# Patient Record
Sex: Male | Born: 1980 | Race: White | Hispanic: No | Marital: Married | Smoking: Current every day smoker
Health system: Southern US, Community
[De-identification: ages and names within clinical notes are randomized; demographics above are authoritative.]

## PROBLEM LIST (undated history)

## (undated) DIAGNOSIS — M419 Scoliosis, unspecified: Secondary | ICD-10-CM

## (undated) DIAGNOSIS — S3992XA Unspecified injury of lower back, initial encounter: Secondary | ICD-10-CM

## (undated) DIAGNOSIS — S199XXA Unspecified injury of neck, initial encounter: Secondary | ICD-10-CM

## (undated) DIAGNOSIS — B1920 Unspecified viral hepatitis C without hepatic coma: Secondary | ICD-10-CM

## (undated) HISTORY — PX: MOUTH SURGERY: SHX715

## (undated) HISTORY — DX: Unspecified viral hepatitis C without hepatic coma: B19.20

## (undated) HISTORY — DX: Unspecified injury of lower back, initial encounter: S39.92XA

## (undated) HISTORY — DX: Scoliosis, unspecified: M41.9

## (undated) HISTORY — DX: Unspecified injury of neck, initial encounter: S19.9XXA

---

## 2005-01-30 ENCOUNTER — Emergency Department: Payer: Self-pay | Admitting: Emergency Medicine

## 2005-07-22 ENCOUNTER — Emergency Department: Payer: Self-pay | Admitting: Emergency Medicine

## 2005-08-29 ENCOUNTER — Emergency Department: Payer: Self-pay | Admitting: Emergency Medicine

## 2005-08-29 ENCOUNTER — Other Ambulatory Visit: Payer: Self-pay

## 2005-09-08 ENCOUNTER — Inpatient Hospital Stay (HOSPITAL_COMMUNITY): Admission: AD | Admit: 2005-09-08 | Discharge: 2005-09-14 | Payer: Self-pay | Admitting: Psychiatry

## 2005-09-09 ENCOUNTER — Ambulatory Visit: Payer: Self-pay | Admitting: Psychiatry

## 2005-12-21 ENCOUNTER — Emergency Department: Payer: Self-pay | Admitting: Emergency Medicine

## 2005-12-21 ENCOUNTER — Other Ambulatory Visit: Payer: Self-pay

## 2007-05-25 ENCOUNTER — Ambulatory Visit: Payer: Self-pay | Admitting: Family Medicine

## 2007-06-08 ENCOUNTER — Emergency Department: Payer: Self-pay | Admitting: Emergency Medicine

## 2007-06-11 ENCOUNTER — Emergency Department: Payer: Self-pay | Admitting: Emergency Medicine

## 2007-08-21 ENCOUNTER — Emergency Department: Payer: Self-pay | Admitting: Emergency Medicine

## 2007-08-21 ENCOUNTER — Other Ambulatory Visit: Payer: Self-pay

## 2007-08-28 ENCOUNTER — Emergency Department: Payer: Self-pay | Admitting: Emergency Medicine

## 2007-10-08 ENCOUNTER — Emergency Department: Payer: Self-pay | Admitting: Emergency Medicine

## 2008-06-03 ENCOUNTER — Emergency Department (HOSPITAL_COMMUNITY): Admission: EM | Admit: 2008-06-03 | Discharge: 2008-06-03 | Payer: Self-pay | Admitting: Emergency Medicine

## 2008-06-05 ENCOUNTER — Emergency Department (HOSPITAL_COMMUNITY): Admission: EM | Admit: 2008-06-05 | Discharge: 2008-06-05 | Payer: Self-pay | Admitting: Emergency Medicine

## 2008-06-17 ENCOUNTER — Emergency Department: Payer: Self-pay | Admitting: Emergency Medicine

## 2008-11-23 ENCOUNTER — Emergency Department (HOSPITAL_COMMUNITY): Admission: EM | Admit: 2008-11-23 | Discharge: 2008-11-23 | Payer: Self-pay | Admitting: Emergency Medicine

## 2008-12-03 ENCOUNTER — Emergency Department: Payer: Self-pay | Admitting: Internal Medicine

## 2009-12-08 ENCOUNTER — Inpatient Hospital Stay: Payer: Self-pay | Admitting: Internal Medicine

## 2009-12-17 ENCOUNTER — Emergency Department: Payer: Self-pay | Admitting: Emergency Medicine
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
## 2010-11-26 NOTE — Discharge Summary (Signed)
NAMEBRAYLEE, BOSHER NO.:  [id number]   MEDICAL RECORD NO.:  [id number]          PATIENT TYPE:  IPS   LOCATION:  0506                          FACILITY:  [facility]   PHYSICIAN:  Jeanice Lim, M.D. DATE OF BIRTH:  [birth year]   DATE OF ADMISSION:  09/08/2005  DATE OF DISCHARGE:  09/14/2005                                 DISCHARGE SUMMARY   IDENTIFYING DATA:  This is a 30 year old white male involuntarily admitted   DICTATION ENDED HERE      Jeanice Lim, M.D.  Electronically Signed     JEM/MEDQ  D:  10/09/2005  T:  10/10/2005  Job:  [id number]
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
## 2010-11-26 NOTE — Discharge Summary (Signed)
Bryan Patton, Bryan Patton             ACCOUNT NO.:  [id number]   MEDICAL RECORD NO.:  [id number]          PATIENT TYPE:  IPS   LOCATION:  0506                          [facility]ACILITY:  [facility]   PHYSICIAN:  Jeanice Lim, M.D. DATE OF BIRTH:  15-Mar-1981   DATE OF ADMISSION:  09/08/2005  DATE OF DISCHARGE:  09/14/2005                                 DISCHARGE SUMMARY   IDENTIFYING DATA:  This is a 30 year old male, single, involuntarily  admitted.  Petitioned by mother for paranoid behavior.  Refusing to leave  the house, fear people were going to break in.  Being concerned with his  safety when he took a knife out of the kitchen and was keeping them next to  his bed, fearing that someone saw them.  The patient is a poor historian.  Denying all symptoms.  Insight generally superficial.  A little bit of  agitation when he had mother on the phone.  Clearly guarded, paranoid but  enough insight to attempt to deny the presence of both symptoms.  The  patient had been hospitalized at [facility] for alcohol abuse and  bizarre behavior on August 31, 2005.  Released from [facility] September 01, 2005 on Risperdal 1 mg twice a day and had been abusing alcohol at that  time but apparently no alcohol had been consumed for the last week or two.   PAST PSYCHIATRIC HISTORY:  Second psychiatric admission, likely for first  psychiatric episode.   SOCIAL HISTORY:  This is a single white male currently living with mother.  Feels that he needs to be employed and everything would be fine and, if he  got sleep, everything would be fine.   FAMILY HISTORY:  History of depressive disorder in the father who committed  suicide in 2005.   MEDICAL HISTORY:  The patient denied significant past medical history.  Had  had asthma as a child.  Had been noncompliant with Risperdal.   ALLERGIES:  No known drug allergies.   PHYSICAL EXAMINATION:  Physical and neurologic exam essentially within  normal  limits.   REVIEW OF SYSTEMS:  Negative.   LABORATORY DATA:  Routine admission labs essentially within normal limits.   MENTAL STATUS EXAM:  Fully alert male.  Anxious affect.  Clear, compulsive  movements.  Manner guarded.  Attention variable.  Clearly possibly  distracted.  Possible internal distractions, looking away, focusing on the  wall next to him, glancing around and then trying to give short, superficial  answers that everything is okay and that people have problems and people who  have problems are in the hospital.  Trying to rationalize any questions that  are brought up, somewhat defensive and guarded.  Clearly, intention is for  him to be released from the hospital.  However, he does appear to be  experiencing a thought disorder which has caused distress and safety  concerns for the mother.  The patient's concentration and attention was  impaired.  No pressure of speech, however.  Mood instability, able to settle  self down.  Other than talking to mother, did not get agitated.  Mood was  guarded, anxious, paranoid, somewhat dysphoric and distractible.  Seems to  minimize reasons for being hospitalized and attempting but very hard to  appear normal.  Insight superficial.  Impulse control guarded.  Judgment and  insight impaired.  Cognition intact.   ADMISSION DIAGNOSES:  AXIS I:  Mood disorder not otherwise specified.  Rule  out psychotic disorder not otherwise specified.  History of alcohol abuse.  AXIS II:  None.  AXIS III:  Tachycardia not otherwise specified, decreased TSH, rule out  hypothyroidism.  AXIS IV:  Deferred.  AXIS V:  20-25; unclear of the last year, however likely 55-60.   HOSPITAL COURSE:  The patient to be placed on safety checks and was ordered  p.r.n. medications for possible unpredictable behavior, agitation.  The  patient was given now medications after getting agitated when talking to his  mother on the phone.  Reported having no problems.  No side  effects from the  medications.  Not wanting to really take a lot of medications.  Admitting  that his primary problem may be having difficulty sleeping and that, if he  had a job, he would be fine.  The patient gradually showed less guardedness  and some increased openness in describing the symptoms that he had  experienced, including paranoid thoughts, feeling that someone was going to  break in and kill him or his mom, misinterpreting sounds and believing that  he needed to defend himself and have knives near him.  Not sleeping for some  time, pacing, agitated, possibly having side effects from the Risperdal and  not compliant.  Did consistently state that he had not been drinking  recently which is likely but likely was noncompliant with the Risperdal,  possibly due to intolerability.  The patient's insight gradually increased.  The patient was aware that he did need to be hospitalized.  The patient was  optimized on Zyprexa Zydis which resulted in improved sleep and improved  thought process.  The patient likely was mildly paranoid but able to discuss  this and denied any safety concerns.  No suicidal thoughts, homicidal  thoughts and no feeling that someone was attempting to kill him any longer  and is aware that this was his distorted thinking.  Significant improvement  in insight by the time of discharge.   CONDITION ON DISCHARGE:  The patient was discharged in improved condition.  Given medication education again and reported motivation to be compliant  with the follow-up plan.   DISCHARGE MEDICATIONS:  1.  Ambien 10 mg q.h.s. p.r.n. insomnia.  2.  Zyprexa Zydis 15 mg at 9 p.m.  3.  Lamictal 25 mg q.a.m. for 11 days and then 2 q.a.m. targeting possible      mood instability and a thought disorder.  Possible first break of      bipolar with psychotic features versus schizoaffective disorder.     History of a family suicide and possible mood disorder with follow-up at       [facility] with Dr. Lang Snow on September 20, 2005      at 3 p.m.   DISCHARGE DIAGNOSES:  AXIS I:  Mood disorder not otherwise specified.  Rule  out psychotic disorder not otherwise specified.  History of alcohol abuse.  AXIS II:  None.  AXIS III:  Tachycardia not otherwise specified, decreased TSH, rule out  hypothyroidism.  AXIS IV:  Deferred.  AXIS V:  GAF on discharge 55.   The patient was educated  regarding his condition and his coping skills and a  safety plan as well as medications, risk/benefit ratio and alternative  treatments, risk of TD, EPS, metabolic issues and other precautions.  The  patient was set up with therapy and discharged in improved condition.  Mother was comfortable with the follow-up plan and happy with his  improvement.      Jeanice Lim, M.D.  Electronically Signed     JEM/MEDQ  D:  10/09/2005  T:  10/10/2005  Job:  [id number]

## 2011-06-17 ENCOUNTER — Ambulatory Visit: Payer: Self-pay | Admitting: Rheumatology

## 2011-08-11 ENCOUNTER — Ambulatory Visit: Payer: Self-pay | Admitting: Pain Medicine

## 2011-08-11 ENCOUNTER — Ambulatory Visit: Payer: Self-pay | Admitting: Internal Medicine

## 2011-09-02 ENCOUNTER — Emergency Department: Payer: Self-pay | Admitting: Emergency Medicine

## 2011-09-02 LAB — CBC
HCT: 48.7 % (ref 40.0–52.0)
MCH: 30.8 pg (ref 26.0–34.0)
MCHC: 33.7 g/dL (ref 32.0–36.0)
MCV: 91 fL (ref 80–100)
Platelet: 307 10*3/uL (ref 150–440)
RDW: 13.7 % (ref 11.5–14.5)

## 2011-09-02 LAB — URINALYSIS, COMPLETE
Bacteria: NONE SEEN
Blood: NEGATIVE
Glucose,UR: NEGATIVE mg/dL (ref 0–75)
Leukocyte Esterase: NEGATIVE
Nitrite: NEGATIVE
Ph: 8 (ref 4.5–8.0)

## 2011-09-02 LAB — COMPREHENSIVE METABOLIC PANEL
Albumin: 4.3 g/dL (ref 3.4–5.0)
Bilirubin,Total: 0.4 mg/dL (ref 0.2–1.0)
EGFR (African American): >60
SGPT (ALT): 82 U/L — ABNORMAL HIGH
Sodium: 141 mmol/L (ref 136–145)
Total Protein: 8.2 g/dL (ref 6.4–8.2)

## 2011-09-02 LAB — LIPASE, BLOOD: Lipase: 128 U/L (ref 73–393)

## 2011-11-12 ENCOUNTER — Emergency Department: Payer: Self-pay | Admitting: Emergency Medicine

## 2011-11-12 LAB — CBC
HCT: 44 % (ref 40.0–52.0)
MCH: 30.6 pg (ref 26.0–34.0)
MCHC: 34.1 g/dL (ref 32.0–36.0)
MCV: 90 fL (ref 80–100)
Platelet: 289 10*3/uL (ref 150–440)
RDW: 13.1 % (ref 11.5–14.5)
WBC: 6.9 10*3/uL (ref 3.8–10.6)

## 2011-11-12 LAB — COMPREHENSIVE METABOLIC PANEL
Albumin: 4.2 g/dL (ref 3.4–5.0)
Anion Gap: 8 (ref 7–16)
BUN: 15 mg/dL (ref 7–18)
Bilirubin,Total: 0.4 mg/dL (ref 0.2–1.0)
Creatinine: 0.97 mg/dL (ref 0.60–1.30)
EGFR (African American): >60
EGFR (Non-African Amer.): >60
Glucose: 87 mg/dL (ref 65–99)
Osmolality: 281 (ref 275–301)
Potassium: 3.9 mmol/L (ref 3.5–5.1)
SGOT(AST): 42 U/L — ABNORMAL HIGH (ref 15–37)
SGPT (ALT): 74 U/L
Total Protein: 7.9 g/dL (ref 6.4–8.2)

## 2011-11-12 LAB — DRUG SCREEN, URINE
Amphetamines, Ur Screen: NEGATIVE (ref ?–1000)
Barbiturates, Ur Screen: NEGATIVE (ref ?–200)
Benzodiazepine, Ur Scrn: NEGATIVE (ref ?–200)
Methadone, Ur Screen: POSITIVE (ref ?–300)
Phencyclidine (PCP) Ur S: NEGATIVE (ref ?–25)

## 2011-11-12 LAB — URINALYSIS, COMPLETE
Bacteria: NEGATIVE
Blood: NEGATIVE
Ketone: NEGATIVE
Nitrite: NEGATIVE
Ph: 6 (ref 4.5–8.0)
Protein: NEGATIVE
Specific Gravity: 1.013 (ref 1.003–1.030)

## 2012-02-29 ENCOUNTER — Emergency Department: Payer: Self-pay

## 2012-03-17 ENCOUNTER — Inpatient Hospital Stay: Payer: Self-pay | Admitting: Psychiatry

## 2012-03-17 LAB — URINALYSIS, COMPLETE
Bilirubin,UR: NEGATIVE
Glucose,UR: NEGATIVE mg/dL (ref 0–75)
Leukocyte Esterase: NEGATIVE
Nitrite: NEGATIVE
Squamous Epithelial: <1
WBC UR: 2 /HPF (ref 0–5)

## 2012-03-17 LAB — DRUG SCREEN, URINE
Barbiturates, Ur Screen: NEGATIVE (ref ?–200)
Cannabinoid 50 Ng, Ur ~~LOC~~: NEGATIVE (ref ?–50)
Cocaine Metabolite,Ur ~~LOC~~: NEGATIVE (ref ?–300)
Methadone, Ur Screen: POSITIVE (ref ?–300)

## 2012-03-17 LAB — COMPREHENSIVE METABOLIC PANEL
Albumin: 4.5 g/dL (ref 3.4–5.0)
Alkaline Phosphatase: 94 U/L (ref 50–136)
Bilirubin,Total: 0.4 mg/dL (ref 0.2–1.0)
Co2: 29 mmol/L (ref 21–32)
Creatinine: 0.98 mg/dL (ref 0.60–1.30)
Osmolality: 280 (ref 275–301)
Sodium: 140 mmol/L (ref 136–145)

## 2012-03-17 LAB — CBC
HCT: 46.2 % (ref 40.0–52.0)
MCH: 30.1 pg (ref 26.0–34.0)
MCV: 89 fL (ref 80–100)

## 2012-03-18 LAB — BEHAVIORAL MEDICINE 1 PANEL
Alkaline Phosphatase: 73 U/L (ref 50–136)
Basophil #: 0.1 10*3/uL (ref 0.0–0.1)
Basophil %: 0.9 %
Calcium, Total: 9.6 mg/dL (ref 8.5–10.1)
EGFR (African American): >60
EGFR (Non-African Amer.): >60
Eosinophil %: 2.1 %
Glucose: 101 mg/dL — ABNORMAL HIGH (ref 65–99)
HGB: 14.6 g/dL (ref 13.0–18.0)
Lymphocyte #: 1.8 10*3/uL (ref 1.0–3.6)
MCH: 29.9 pg (ref 26.0–34.0)
MCV: 88 fL (ref 80–100)
Monocyte #: 0.6 x10 3/mm (ref 0.2–1.0)
RBC: 4.88 10*6/uL (ref 4.40–5.90)
SGOT(AST): 39 U/L — ABNORMAL HIGH (ref 15–37)
SGPT (ALT): 54 U/L (ref 12–78)
Thyroid Stimulating Horm: 0.708 u[IU]/mL

## 2012-04-08 ENCOUNTER — Emergency Department: Payer: Self-pay | Admitting: Emergency Medicine

## 2012-04-10 ENCOUNTER — Emergency Department: Payer: Self-pay | Admitting: Unknown Physician Specialty

## 2012-04-11 ENCOUNTER — Emergency Department: Payer: Self-pay | Admitting: Emergency Medicine

## 2012-04-18 ENCOUNTER — Emergency Department: Payer: Self-pay | Admitting: Emergency Medicine

## 2012-04-18 LAB — CBC
HCT: 46.6 % (ref 40.0–52.0)
HGB: 15.5 g/dL (ref 13.0–18.0)
MCH: 29.7 pg (ref 26.0–34.0)
MCV: 89 fL (ref 80–100)
RBC: 5.21 10*6/uL (ref 4.40–5.90)

## 2012-04-18 LAB — BASIC METABOLIC PANEL
Chloride: 104 mmol/L (ref 98–107)
Co2: 32 mmol/L (ref 21–32)
Creatinine: 0.9 mg/dL (ref 0.60–1.30)
Sodium: 143 mmol/L (ref 136–145)

## 2012-05-03 ENCOUNTER — Encounter: Payer: Self-pay | Admitting: *Deleted

## 2012-05-09 ENCOUNTER — Ambulatory Visit: Payer: Self-pay | Admitting: Cardiovascular Disease

## 2012-05-14 ENCOUNTER — Encounter: Payer: Self-pay | Admitting: Cardiovascular Disease

## 2012-05-22 ENCOUNTER — Encounter: Payer: Self-pay | Admitting: Cardiovascular Disease

## 2012-05-22 ENCOUNTER — Ambulatory Visit (INDEPENDENT_AMBULATORY_CARE_PROVIDER_SITE_OTHER): Payer: Medicaid Other | Admitting: Cardiovascular Disease

## 2012-05-22 VITALS — BP 124/84 | HR 67 | Ht 72.0 in | Wt 172.0 lb

## 2012-05-22 DIAGNOSIS — R9389 Abnormal findings on diagnostic imaging of other specified body structures: Secondary | ICD-10-CM

## 2012-05-22 DIAGNOSIS — R079 Chest pain, unspecified: Secondary | ICD-10-CM

## 2012-05-22 DIAGNOSIS — R931 Abnormal findings on diagnostic imaging of heart and coronary circulation: Secondary | ICD-10-CM
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
## 2012-05-22 NOTE — Patient Instructions (Addendum)
STOP smoking  No follow up scheduled.  Call us if we can be of help to you
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
## 2012-05-23 ENCOUNTER — Ambulatory Visit: Payer: Self-pay | Admitting: Family Medicine

## 2012-08-08 ENCOUNTER — Ambulatory Visit: Payer: Self-pay | Admitting: Family Medicine

## 2012-11-24 ENCOUNTER — Encounter: Payer: Self-pay | Admitting: Nurse Practitioner

## 2012-11-26 ENCOUNTER — Ambulatory Visit: Payer: Self-pay | Admitting: Nurse Practitioner

## 2012-12-30 ENCOUNTER — Emergency Department (HOSPITAL_COMMUNITY)
Admission: EM | Admit: 2012-12-30 | Discharge: 2012-12-31 | Disposition: A | Discharge status: Other Acute Inpatient Hospital | Payer: Medicaid Other | Attending: Emergency Medicine | Admitting: Emergency Medicine

## 2012-12-30 ENCOUNTER — Encounter (HOSPITAL_COMMUNITY): Payer: Self-pay | Admitting: Emergency Medicine

## 2012-12-30 DIAGNOSIS — F319 Bipolar disorder, unspecified: Secondary | ICD-10-CM | POA: Insufficient documentation

## 2012-12-30 DIAGNOSIS — M542 Cervicalgia: Secondary | ICD-10-CM | POA: Insufficient documentation

## 2012-12-30 DIAGNOSIS — F411 Generalized anxiety disorder: Secondary | ICD-10-CM | POA: Insufficient documentation

## 2012-12-30 DIAGNOSIS — F209 Schizophrenia, unspecified: Secondary | ICD-10-CM | POA: Insufficient documentation

## 2012-12-30 DIAGNOSIS — G8929 Other chronic pain: Secondary | ICD-10-CM | POA: Insufficient documentation

## 2012-12-30 DIAGNOSIS — Z888 Allergy status to other drugs, medicaments and biological substances status: Secondary | ICD-10-CM | POA: Insufficient documentation

## 2012-12-30 DIAGNOSIS — M549 Dorsalgia, unspecified: Secondary | ICD-10-CM | POA: Insufficient documentation

## 2012-12-30 DIAGNOSIS — Z79899 Other long term (current) drug therapy: Secondary | ICD-10-CM | POA: Insufficient documentation

## 2012-12-30 DIAGNOSIS — F17200 Nicotine dependence, unspecified, uncomplicated: Secondary | ICD-10-CM | POA: Insufficient documentation

## 2012-12-30 DIAGNOSIS — M4120 Other idiopathic scoliosis, site unspecified: Secondary | ICD-10-CM | POA: Insufficient documentation

## 2012-12-30 DIAGNOSIS — F1910 Other psychoactive substance abuse, uncomplicated: Secondary | ICD-10-CM | POA: Insufficient documentation

## 2012-12-30 DIAGNOSIS — B1920 Unspecified viral hepatitis C without hepatic coma: Secondary | ICD-10-CM | POA: Insufficient documentation

## 2012-12-30 LAB — COMPREHENSIVE METABOLIC PANEL
ALT: 28 U/L (ref 0–53)
AST: 24 U/L (ref 0–37)
CO2: 30 mEq/L (ref 19–32)
Chloride: 101 mEq/L (ref 96–112)
GFR calc non Af Amer: >90 mL/min (ref >90)
Glucose, Bld: 119 mg/dL — ABNORMAL HIGH (ref 70–99)
Sodium: 139 mEq/L (ref 135–145)
Total Bilirubin: 0.2 mg/dL — ABNORMAL LOW (ref 0.3–1.2)

## 2012-12-30 LAB — ACETAMINOPHEN LEVEL: Acetaminophen (Tylenol), Serum: <15 ug/mL (ref 10–30)

## 2012-12-30 LAB — GLUCOSE, CAPILLARY: Glucose-Capillary: 121 mg/dL — ABNORMAL HIGH (ref 70–99)

## 2012-12-30 LAB — CBC
Hemoglobin: 14.9 g/dL (ref 13.0–17.0)
MCV: 87.2 fL (ref 78.0–100.0)
Platelets: 211 10*3/uL (ref 150–400)
RBC: 4.99 MIL/uL (ref 4.22–5.81)
WBC: 7.1 10*3/uL (ref 4.0–10.5)

## 2012-12-30 LAB — RAPID URINE DRUG SCREEN, HOSP PERFORMED
Cocaine: NOT DETECTED
Opiates: POSITIVE — AB

## 2012-12-30 LAB — SALICYLATE LEVEL: Salicylate Lvl: <2 mg/dL — ABNORMAL LOW (ref 2.8–20.0)

## 2012-12-30 MED ORDER — ACETAMINOPHEN 325 MG PO TABS: 650.0000 mg | ORAL_TABLET | ORAL | Status: DC | PRN

## 2012-12-30 MED ORDER — ZOLPIDEM TARTRATE 5 MG PO TABS
5.0000 mg | ORAL_TABLET | Freq: Every evening | ORAL | Status: DC | PRN
  Administered 2012-12-31: 5 mg via ORAL
  Filled 2012-12-30: qty 1

## 2012-12-30 MED ORDER — ONDANSETRON HCL 4 MG PO TABS: 4.0000 mg | ORAL_TABLET | Freq: Three times a day (TID) | ORAL | Status: DC | PRN

## 2012-12-30 MED ORDER — ALUM & MAG HYDROXIDE-SIMETH 200-200-20 MG/5ML PO SUSP: 30.0000 mL | ORAL | Status: DC | PRN

## 2012-12-30 MED ORDER — IBUPROFEN 200 MG PO TABS: 600.0000 mg | ORAL_TABLET | Freq: Three times a day (TID) | ORAL | Status: DC | PRN

## 2012-12-30 MED ORDER — LORAZEPAM 1 MG PO TABS
1.0000 mg | ORAL_TABLET | Freq: Three times a day (TID) | ORAL | Status: DC | PRN
  Administered 2012-12-31: 1 mg via ORAL
  Filled 2012-12-30: qty 1

## 2012-12-30 MED ORDER — NICOTINE 21 MG/24HR TD PT24
21.0000 mg | MEDICATED_PATCH | Freq: Every day | TRANSDERMAL | Status: DC
  Administered 2012-12-30: 21 mg via TRANSDERMAL
  Filled 2012-12-30: qty 1

## 2012-12-30 NOTE — ED Notes (Signed)
Pt here for detox. Pt has methadone pill bottle filled 12/17/2012. Pt has 8 whole pills and 12 1/2 pills left out of 120 pills, reports last dose yesterday. Oxycodone filled 12/17/2012.  16 large pills and 3 whole small pills, 3 1/2 small pills and 1/4 of a small pill all in same jar. Last use of percocet yesterday. Pt also has prescription for xanax and clonopine which he does not have the jar here with him. Family reports pt also has morphine prescription but does not know where the pills are at. Pt admits to over use of medications. Family reports that pt had altered mental status yesterday and feels that he has been overdosing on his medications for past couple of days.
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
## 2012-12-30 NOTE — BH Assessment (Signed)
[facility] Assessment Progress Note      12/30/12.  2350.  [facility] left message asking for bed availability.  [facility] does [facility] male detox bed.  Pt info faxed over for review.  OV is NA due to pt having medicaid.  [facility] is full. Daleen Squibb, LCSWA

## 2012-12-30 NOTE — ED Notes (Signed)
Pt received dinner tray but stated he has had no appetite. Pt is resting in room at this time.
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
## 2012-12-30 NOTE — ED Notes (Signed)
Diet tray ordered 

## 2012-12-31 ENCOUNTER — Encounter (HOSPITAL_COMMUNITY): Payer: Self-pay

## 2012-12-31 ENCOUNTER — Inpatient Hospital Stay (HOSPITAL_COMMUNITY)
Admission: AD | Admit: 2012-12-31 | Discharge: 2013-01-04 | DRG: 897 | Disposition: A | Discharge status: Home / Self Care | Payer: Medicare Other | Source: Intra-hospital | Attending: Psychiatry | Admitting: Psychiatry

## 2012-12-31 DIAGNOSIS — Z79899 Other long term (current) drug therapy: Secondary | ICD-10-CM

## 2012-12-31 DIAGNOSIS — F1320 Sedative, hypnotic or anxiolytic dependence, uncomplicated: Principal | ICD-10-CM

## 2012-12-31 DIAGNOSIS — F339 Major depressive disorder, recurrent, unspecified: Secondary | ICD-10-CM

## 2012-12-31 DIAGNOSIS — F259 Schizoaffective disorder, unspecified: Secondary | ICD-10-CM | POA: Diagnosis present

## 2012-12-31 DIAGNOSIS — F1120 Opioid dependence, uncomplicated: Secondary | ICD-10-CM

## 2012-12-31 DIAGNOSIS — F1920 Other psychoactive substance dependence, uncomplicated: Secondary | ICD-10-CM | POA: Diagnosis present

## 2012-12-31 DIAGNOSIS — R931 Abnormal findings on diagnostic imaging of heart and coronary circulation: Secondary | ICD-10-CM

## 2012-12-31 MED ORDER — CLONIDINE HCL 0.1 MG PO TABS
0.1000 mg | ORAL_TABLET | Freq: Four times a day (QID) | ORAL | Status: AC
  Administered 2012-12-31 – 2013-01-02 (×3): 0.1 mg via ORAL
  Filled 2012-12-31 (×10): qty 1

## 2012-12-31 MED ORDER — ONDANSETRON 4 MG PO TBDP: 4.0000 mg | ORAL_TABLET | Freq: Four times a day (QID) | ORAL | Status: DC | PRN

## 2012-12-31 MED ORDER — CHLORDIAZEPOXIDE HCL 25 MG PO CAPS
25.0000 mg | ORAL_CAPSULE | Freq: Four times a day (QID) | ORAL | Status: AC
  Administered 2012-12-31 – 2013-01-01 (×5): 25 mg via ORAL
  Filled 2012-12-31 (×5): qty 1

## 2012-12-31 MED ORDER — ALUM & MAG HYDROXIDE-SIMETH 200-200-20 MG/5ML PO SUSP: 30.0000 mL | ORAL | Status: DC | PRN

## 2012-12-31 MED ORDER — CLONIDINE HCL 0.1 MG PO TABS
0.1000 mg | ORAL_TABLET | Freq: Every day | ORAL | Status: DC
  Filled 2012-12-31 (×2): qty 1

## 2012-12-31 MED ORDER — LOPERAMIDE HCL 2 MG PO CAPS: 2.0000 mg | ORAL_CAPSULE | ORAL | Status: AC | PRN

## 2012-12-31 MED ORDER — ACETAMINOPHEN 325 MG PO TABS
650.0000 mg | ORAL_TABLET | Freq: Four times a day (QID) | ORAL | Status: DC | PRN
  Administered 2013-01-02: 650 mg via ORAL

## 2012-12-31 MED ORDER — CHLORDIAZEPOXIDE HCL 25 MG PO CAPS
25.0000 mg | ORAL_CAPSULE | Freq: Every day | ORAL | Status: AC
  Administered 2013-01-04: 25 mg via ORAL
  Filled 2012-12-31: qty 1

## 2012-12-31 MED ORDER — HYDROXYZINE HCL 25 MG PO TABS
25.0000 mg | ORAL_TABLET | Freq: Four times a day (QID) | ORAL | Status: AC | PRN
  Administered 2013-01-01: 25 mg via ORAL
  Filled 2012-12-31: qty 1

## 2012-12-31 MED ORDER — MAGNESIUM HYDROXIDE 400 MG/5ML PO SUSP: 30.0000 mL | Freq: Every day | ORAL | Status: DC | PRN

## 2012-12-31 MED ORDER — CHLORDIAZEPOXIDE HCL 25 MG PO CAPS
25.0000 mg | ORAL_CAPSULE | Freq: Three times a day (TID) | ORAL | Status: AC
  Administered 2013-01-02 (×3): 25 mg via ORAL
  Filled 2012-12-31 (×3): qty 1

## 2012-12-31 MED ORDER — DICYCLOMINE HCL 20 MG PO TABS: 20.0000 mg | ORAL_TABLET | Freq: Four times a day (QID) | ORAL | Status: DC | PRN

## 2012-12-31 MED ORDER — CLONIDINE HCL 0.1 MG PO TABS
0.1000 mg | ORAL_TABLET | ORAL | Status: DC
  Administered 2013-01-03 – 2013-01-04 (×3): 0.1 mg via ORAL
  Filled 2012-12-31 (×5): qty 1

## 2012-12-31 MED ORDER — THIAMINE HCL 100 MG/ML IJ SOLN: 100.0000 mg | Freq: Once | INTRAMUSCULAR | Status: DC

## 2012-12-31 MED ORDER — CHLORDIAZEPOXIDE HCL 25 MG PO CAPS
25.0000 mg | ORAL_CAPSULE | ORAL | Status: AC
  Administered 2013-01-03 (×2): 25 mg via ORAL
  Filled 2012-12-31 (×2): qty 1

## 2012-12-31 MED ORDER — CHLORDIAZEPOXIDE HCL 25 MG PO CAPS
50.0000 mg | ORAL_CAPSULE | Freq: Once | ORAL | Status: AC
  Administered 2012-12-31: 50 mg via ORAL
  Filled 2012-12-31: qty 2

## 2012-12-31 MED ORDER — VITAMIN B-1 100 MG PO TABS
100.0000 mg | ORAL_TABLET | Freq: Every day | ORAL | Status: DC
  Administered 2013-01-01 – 2013-01-04 (×4): 100 mg via ORAL
  Filled 2012-12-31 (×6): qty 1

## 2012-12-31 MED ORDER — NAPROXEN 500 MG PO TABS
500.0000 mg | ORAL_TABLET | Freq: Two times a day (BID) | ORAL | Status: DC | PRN
  Administered 2013-01-01 – 2013-01-04 (×4): 500 mg via ORAL
  Filled 2012-12-31 (×4): qty 1

## 2012-12-31 MED ORDER — METHOCARBAMOL 500 MG PO TABS
500.0000 mg | ORAL_TABLET | Freq: Three times a day (TID) | ORAL | Status: DC | PRN
  Administered 2013-01-02 – 2013-01-03 (×3): 500 mg via ORAL
  Filled 2012-12-31 (×3): qty 1

## 2012-12-31 MED ORDER — CHLORDIAZEPOXIDE HCL 25 MG PO CAPS
25.0000 mg | ORAL_CAPSULE | Freq: Four times a day (QID) | ORAL | Status: AC | PRN
  Administered 2013-01-01 – 2013-01-03 (×4): 25 mg via ORAL
  Filled 2012-12-31 (×4): qty 1

## 2012-12-31 MED ORDER — ENSURE COMPLETE PO LIQD
237.0000 mL | Freq: Three times a day (TID) | ORAL | Status: DC
  Filled 2012-12-31: qty 237

## 2012-12-31 MED ORDER — ADULT MULTIVITAMIN W/MINERALS CH
1.0000 | ORAL_TABLET | Freq: Every day | ORAL | Status: DC
  Administered 2012-12-31 – 2013-01-04 (×5): 1 via ORAL
  Filled 2012-12-31 (×8): qty 1
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
## 2012-12-31 NOTE — BH Assessment (Signed)
Assessment Note  Update:  Received call from [facility] stating pt accepted to [facility] by Shelda Jakes, PaA to Dr. Dub Mikes to bed 305-1 and that pt could be transported to [facility].  Completed support paperwork with pt and updated assessment disposition.  Updated EDP Hyacinth Meeker and ED staff.  ED staff arranging transport to [facility] via security, as pt is voluntary.     Disposition:  Disposition Initial Assessment Completed for this Encounter: Yes Disposition of Patient: Inpatient treatment program Type of inpatient treatment program: Adult (Pt accepted [facility])  On Site Evaluation by:   Reviewed with Physician:  Rocky Morel, Rennis Harding 12/31/2012 3:41 PM

## 2012-12-31 NOTE — Progress Notes (Signed)
Patient home meds including narcotics have been checked into [facility] main pharmacy. Will store in vault overnight and then have them transferred for storage at [facility] 6/24. Counts verified with RN.  Severiano Gilbert  12/31/2012 3:44 PM

## 2012-12-31 NOTE — ED Provider Notes (Signed)
Pt has been accepted to Dr. Dub Mikes at [facility].  Vida Roller, MD 12/31/12 [phone]

## 2012-12-31 NOTE — Tx Team (Signed)
Initial Interdisciplinary Treatment Plan  PATIENT STRENGTHS: (choose at least two) Ability for insight General fund of knowledge  PATIENT STRESSORS: Health problems Legal issue Marital or family conflict Substance abuse   PROBLEM LIST: Problem List/Patient Goals Date to be addressed Date deferred Reason deferred Estimated date of resolution  Depression 12/31/12     Substance Abuse 12/31/12     Risk for suicide 12/31/12                                          DISCHARGE CRITERIA:  Adequate post-discharge living arrangements Improved stabilization in mood, thinking, and/or behavior Medical problems require only outpatient monitoring  PRELIMINARY DISCHARGE PLAN: Attend aftercare/continuing care group Outpatient therapy  PATIENT/FAMIILY INVOLVEMENT: This treatment plan has been presented to and reviewed with the patient, NAYEF COLLEGE.  The patient and family have been given the opportunity to ask questions and make suggestions.  Delos Haring 12/31/2012, 6:51 PM

## 2012-12-31 NOTE — ED Notes (Signed)
Pt's lunch tray still in room untouched.  Informed during shift report that pt did not eat breakfast this morning or dinner last night.  Will notify MD and request nutritional supplement.

## 2013-01-01 DIAGNOSIS — F1320 Sedative, hypnotic or anxiolytic dependence, uncomplicated: Secondary | ICD-10-CM

## 2013-01-01 DIAGNOSIS — F1120 Opioid dependence, uncomplicated: Secondary | ICD-10-CM

## 2013-01-01 DIAGNOSIS — F259 Schizoaffective disorder, unspecified: Secondary | ICD-10-CM

## 2013-01-01 MED ORDER — PREGABALIN 50 MG PO CAPS
100.0000 mg | ORAL_CAPSULE | Freq: Three times a day (TID) | ORAL | Status: DC
  Administered 2013-01-01 – 2013-01-04 (×10): 100 mg via ORAL
  Filled 2013-01-01 (×4): qty 2
  Filled 2013-01-01 (×2): qty 1
  Filled 2013-01-01 (×5): qty 2

## 2013-01-01 MED ORDER — ENSURE COMPLETE PO LIQD
237.0000 mL | Freq: Two times a day (BID) | ORAL | Status: DC
  Administered 2013-01-01 – 2013-01-04 (×8): 237 mL via ORAL

## 2013-01-01 MED ORDER — RISPERIDONE 0.5 MG PO TABS
0.5000 mg | ORAL_TABLET | Freq: Two times a day (BID) | ORAL | Status: DC
  Filled 2013-01-01 (×4): qty 1

## 2013-01-01 MED ORDER — POLYETHYLENE GLYCOL 3350 17 G PO PACK
17.0000 g | PACK | Freq: Every day | ORAL | Status: DC
  Administered 2013-01-01: 17 g via ORAL
  Filled 2013-01-01 (×6): qty 1

## 2013-01-01 MED ORDER — MELOXICAM 15 MG PO TABS
15.0000 mg | ORAL_TABLET | Freq: Every day | ORAL | Status: DC
  Administered 2013-01-01 – 2013-01-04 (×4): 15 mg via ORAL
  Filled 2013-01-01 (×6): qty 1

## 2013-01-01 MED ORDER — CARBAMAZEPINE ER 200 MG PO CP12
200.0000 mg | ORAL_CAPSULE | Freq: Every day | ORAL | Status: DC
  Administered 2013-01-01 – 2013-01-03 (×3): 200 mg via ORAL
  Filled 2013-01-01 (×2): qty 4
  Filled 2013-01-01 (×4): qty 1

## 2013-01-01 NOTE — [facility] Group Notes (Signed)
[facility] LCSW Aftercare Discharge Planning Group Note   01/01/2013 10:37 AM  Participation Quality:  DID NOT ATTEND   Smart, Herbert Seta

## 2013-01-01 NOTE — ED Provider Notes (Signed)
Medical screening examination/treatment/procedure(s) were performed by non-physician practitioner and as supervising physician I was immediately available for consultation/collaboration.   Charles B. Bernette Mayers, MD 01/01/13 1440

## 2013-01-01 NOTE — Progress Notes (Signed)
Pt up and in the dayroom.  Pt reports he is feeling somewhat better than last night, though he reports feeling restless and achy.  He denies SI/HI/AV.  He says his appetite is still poor.  He has been to few groups today.  Pt is receiving prns as ordered.  Pt makes his needs known to staff.  Support and encouragement offered.  Safety maintained with q15 checks.

## 2013-01-01 NOTE — Progress Notes (Signed)
Patient ID: Bryan Patton, male   DOB: January 30, 1981, 32 y.o.   MRN: [id number] He has been in bed sleeping most of the day. Has been up for medications and did not eat lunch. He has requested and received prn naproxen at 08:03 AM.  He has denied having withdrawal symptoms.
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
## 2013-01-01 NOTE — [facility] Group Notes (Signed)
[facility] LCSW Group Therapy  01/01/2013 2:05 PM  Type of Therapy:  Group Therapy  Participation Level:  Did Not Attend  Smart, Herbert Seta 01/01/2013, 2:05 PM

## 2013-01-01 NOTE — Progress Notes (Signed)
Recreation Therapy Notes  Date: 06.24.2014 Time: 2:30pm Location: [address] Dayroom     Group Topic/Focus: Musician (AAA/T)  Participation Level: Active  Participation Quality: Appropriate  Affect: Euthymic  Cognitive: Appropriate  Additional Comments: 06.24.2014 Session = AAA Session; Dog Team = [facility] and handler  Patient with peers educated on search and rescue. Patient pet and visited with [place]. Patient interacted appropriately with peers, LRT and dog team.    Jearl Klinefelter, LRT/CTRS  Jearl Klinefelter 01/01/2013 5:05 PM

## 2013-01-01 NOTE — [facility] Suicide Risk Assessment (Signed)
Suicide Risk Assessment  Admission Assessment     Nursing information obtained from:  Patient Demographic factors:  Male;Divorced or widowed;Caucasian;Unemployed Current Mental Status:  NA Loss Factors:  Loss of significant relationship;Decline in physical health;Legal issues Historical Factors:  NA Risk Reduction Factors:  Religious beliefs about death;Positive social support  CLINICAL FACTORS:   Depression:   Anhedonia Comorbid alcohol abuse/dependence Hopelessness Impulsivity Insomnia Alcohol/Substance Abuse/Dependencies Previous Psychiatric Diagnoses and Treatments  COGNITIVE FEATURES THAT CONTRIBUTE TO RISK:  Closed-mindedness    SUICIDE RISK:   Minimal: No identifiable suicidal ideation.  Patients presenting with no risk factors but with morbid ruminations; may be classified as minimal risk based on the severity of the depressive symptoms  PLAN OF CARE:1. Admit for crisis management and stabilization. 2. Medication management to reduce current symptoms to base line and improve the     patient's overall level of functioning 3. Treat health problems as indicated. 4. Develop treatment plan to decrease risk of relapse upon discharge and the need for     readmission. 5. Psycho-social education regarding relapse prevention and self care. 6. Health care follow up as needed for medical problems. 7. Restart home medications where appropriate.   I certify that inpatient services furnished can reasonably be expected to improve the patient's condition.  Bryan Snellgrove,MD 01/01/2013, 11:36 AM

## 2013-01-01 NOTE — Progress Notes (Signed)
NUTRITION ASSESSMENT  Pt identified as at risk on the Malnutrition Screen Tool  INTERVENTION: 1. Educated patient on the importance of nutrition and encouraged intake of food and beverages. 2. Discussed weight goals. 3. Supplements: Ensure Complete po BID, each supplement provides 350 kcal and 13 grams of protein. Continue MVI and thiamine daily.   NUTRITION DIAGNOSIS: Unintentional weight loss related to sub-optimal intake as evidenced by pt report.   Goal: Pt to meet >/= 90% of their estimated nutrition needs.  Monitor:  PO intake  Assessment:  Patient admitted for detox of prescription drugs and ETOH, MDD and recent death of wife per pt. Patient reports a decreased appetite with decreased intake.  Current height and weight do not match patient's report of 5'9" and 160 lbs with UBW of 169-170 lbs.  This would put BMI at 24 which is WNL.  Patient laying in bed at visit and did not verify height.  32 y.o. male  Height: Ht Readings from Last 1 Encounters:  08/02/12 6\' 1"  (1.854 m)    Weight: Wt Readings from Last 1 Encounters:  08/02/12 188 lb (85.276 kg)    Weight Hx: Wt Readings from Last 10 Encounters:  08/02/12 188 lb (85.276 kg)  05/22/12 172 lb (78.019 kg)    Estimated Nutritional Needs: Kcal: 25-30 kcal/kg Protein: > 1 gram protein/kg Fluid: 1 ml/kcal  Diet Order: General Pt is also offered choice of unit snacks mid-morning and mid-afternoon.  Pt is eating as desired.   Lab results and medications reviewed.   Oran Rein, RD, LDN Clinical Inpatient Dietitian Pager:  [phone] Weekend and after hours pager:  [phone]

## 2013-01-02 DIAGNOSIS — F1320 Sedative, hypnotic or anxiolytic dependence, uncomplicated: Principal | ICD-10-CM

## 2013-01-02 DIAGNOSIS — F1120 Opioid dependence, uncomplicated: Secondary | ICD-10-CM

## 2013-01-02 DIAGNOSIS — F259 Schizoaffective disorder, unspecified: Secondary | ICD-10-CM

## 2013-01-02 MED ORDER — DIPHENHYDRAMINE HCL 50 MG PO CAPS
50.0000 mg | ORAL_CAPSULE | Freq: Every evening | ORAL | Status: DC | PRN
  Administered 2013-01-02 – 2013-01-04 (×4): 50 mg via ORAL
  Filled 2013-01-02 (×10): qty 1

## 2013-01-02 NOTE — Progress Notes (Signed)
Pt is at the med window for his 8pm medication.  He reports he is still feeling anxious and shaky.  He also asked about another Ensure supplement.  Informed pt that it was ordered twice a day between meals.  Pt states that he only received one today, but it was documented that he had received his two scheduled supplements.  Gave pt another, encouraging him to speak with his MD if he felt he needed more supplements during the day.  Pt denies SI/HI/AV.  He reports he doesn't feel he has improved much.  Pt wants to return to his mother's home after detox.  It was suggested to pt today to follow up with Hospice for grief management.  Pt is open to this.  Pt makes his needs known to staff.  Support and encouragement offered.  Safety maintained with q15 minute checks.
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
## 2013-01-02 NOTE — B[facility] Counselor (Signed)
Adult Comprehensive Assessment  Patient ID: CLYDE ZARRELLA, male   DOB: 05-Jul-1981, 32 y.o.   MRN: [id number]  Information Source: Information source: Patient  Current Stressors:  Educational / Learning stressors: completed 11th grade Employment / Job issues: on disability/back problems and mental health issues Family Relationships: enmeshed relationship with mother. sister is Marine scientist / Lack of resources (include bankruptcy): n/a Housing / Lack of housing: lives with mother.  Physical health (include injuries & life threatening diseases): back problems Social relationships: good social supports. family friends Substance abuse: overusing prescription medications Bereavement / Loss: death of wife a few weeks ago. very traumatic for family.   Living/Environment/Situation:  Living Arrangements: Parent Living conditions (as described by patient or guardian): Living  conditions are comofortable and "wonderful."  How long has patient lived in current situation?: 2 weeks.  What is atmosphere in current home: Comfortable;Loving;Supportive  Family History:  Marital status: Widowed Widowed, when?: December 19, 2012: few weeks. "She was the love of my life."  Does patient have children?: No (2 stepchildren)  Childhood History:  By whom was/is the patient raised?: Both parents Additional childhood history information: Parents were married. "I had a good childhood." Description of patient's relationship with caregiver when they were a child: Close to both paretns as child.  Patient's description of current relationship with people who raised him/her: Father deceased. Still very close with mother.  Does patient have siblings?: Yes (older sister) Number of Siblings: 1 Description of patient's current relationship with siblings: Strained. patient attempting to repair this relationship.  Did patient suffer any verbal/emotional/physical/sexual abuse as a child?: No Did patient suffer from  severe childhood neglect?: No Has patient ever been sexually abused/assaulted/raped as an adolescent or adult?: No Was the patient ever a victim of a crime or a disaster?: No Witnessed domestic violence?: No Has patient been effected by domestic violence as an adult?: No  Education:  Highest grade of school patient has completed: 11th grade.  Currently a student?: No Learning disability?: No  Employment/Work Situation:   Employment situation: On disability Why is patient on disability: chronic pain; "few other things." How long has patient been on disability: 2 years  Patient's job has been impacted by current illness: Yes Describe how patient's job has been impacted: Chronic pain. inability to focus.  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  Has patient ever been in the [employer]?: No Has patient ever served in combat?: No  Financial Resources:   Surveyor, quantity resources: Medicaid;Medicare;Food stamps;Receives SSDI;Support from parents / caregiver Does patient have a Lawyer or guardian?: Yes Name of representative payee or guardian: sister is payee. Holly Rose  Alcohol/Substance Abuse:   What has been your use of drugs/alcohol within the last 12 months?: No alcohol/"I don't do drugs." Overusing prescription medications.  If attempted suicide, did drugs/alcohol play a role in this?: No Alcohol/Substance Abuse Treatment Hx: Past Tx, Inpatient If yes, describe treatment: 2007 [facility] mental illness.  Has alcohol/substance abuse ever caused legal problems?: No  Social Support System:   Patient's Community Support System: Good Describe Community Support System: friends of the family Type of faith/religion: Spiritual/no identified faith How does patient's faith help to cope with current illness?: n/a  Leisure/Recreation:   Leisure and Hobbies: walking, swimming.  Strengths/Needs:    What things does the patient do well?: friendly, caring, compassionate In what areas does patient struggle / problems for  patient: worrying about the future.   Discharge Plan:   Does patient have access to transportation?: Yes ([employer]-Medicaid, mother) Will patient be returning to same living situation after discharge?: Yes (home with mother.) Currently receiving community mental health services: Yes (From Whom) ([facility]/ and PCP) If no, would patient like referral for services when discharged?: Yes (What county?) [facility]) Does patient have financial barriers related to discharge medications?: No (medicare/medicaid)  Summary/Recommendations:    Pt is 32 year old male living in [place], [place] [facility] [place]). His wife passed away a few weeks ago, pt has hx of severe mental illness (schizoaffective disorder) and has been hospitalized at [facility] for mental health issues one time several years ago. He presents to the hospital for detox from opioids and benzos, and unmanageable depressive symptoms. Recommendations for pt include: therapeutic milieu, encourage group attendance and participation, librium/clonidine taper for withdrawal symptom management, medication management for mood stabilization, and development of comprehensive mental wellness plan. Pt provided with Hospice bereavement counseling information for [facility] and will follow up with [facility] in [place] for therapy and medication management. Pt plans to return home with mother after discharge.   Smart, Research scientist (physical sciences). 01/02/2013

## 2013-01-02 NOTE — Progress Notes (Addendum)
D:Pt has been in the bed most of the morning c/o tremors, aches,with pain located in his back. Pt reports that he is detoxing from medications prescribed by a doctor. He rates his depression and hopelessness as a 3 on 1-10 scale with 10 being the most depressed. Pt refused his Risperdal stating that he did not need because he was at [facility] for detox and does not have hallucinations. Pt was also concerned about side effects that he has seen on TV. A:Supported pt to discuss feelings. Offered encouragement and 15 minute checks. Gave prn medication as needed. Reported pt refusal of medication to NP and reasons. Reported to NP clonidine held due to low blood pressure. R:Pt denies si and hi. Safety maintained on the unit.

## 2013-01-02 NOTE — Progress Notes (Signed)
Recreation Therapy Notes  Date: 06.24.2014  Time: 3:00pm Location: [address] Dayroom  Group Topic/Focus: Scientist, physiological, Communication  Participation Level:  Minimal  Participation Quality:  Observation  Affect:  Euthymic  Cognitive:  Appropriate  Additional Comments: Activity: Life Boat; Explanation: Patients were given the following problem solving scenario: We have chartered a Radio producer for the afternoon. Half way through our trip our boat springs a leak and we need to abandon ship. As a group you must decide which 8 of the following people you are going to save from the sinking ship: President Obama, Devona Konig, ex-marine, ex-convict, teacher, banker, Curator, Personnel officer, pregnant woman, male physician, nurse, priest, rabbi, Anthonette Legato, Investment banker, operational.   Patient attended group session, but chose not to participate in group activity. Patient stated the activity was too difficult for him, due to recently dealing with a significant amount of grief and loss. Patient stated the loss of those individuals is directly related to his admission to [facility].   Marykay Lex Kilynn Fitzsimmons, LRT/CTRS  Raigen Jagielski L 01/02/2013 8:55 AM

## 2013-01-02 NOTE — BHH Group Notes (Signed)
[facility] LCSW Group Therapy  01/02/2013 3:02 PM  Type of Therapy:  Group Therapy  Participation Level:  Active  Participation Quality:  Sharing  Affect:  Depressed  Cognitive:  Appropriate  Insight:  Improving  Engagement in Therapy:  Improving  Modes of Intervention:  Discussion, Education, Exploration, Socialization and Support  Summary of Progress/Problems: Emotion Regulation: This group focused on both positive and negative emotion identification and allowed group members to process ways to identify feelings, regulate negative emotions, and find healthy ways to manage internal/external emotions. Group members were asked to reflect on a time when their reaction to an emotion led to a negative outcome and explored how alternative responses using emotion regulation would have benefited them. Group members were also asked to discuss a time when emotion regulation was utilized when a negative emotion was experienced. Bryan Patton talked about grief as his overwhelming emotion that he has been experiencing since the death of his wife earlier this month. Bryan Patton explained to the group how grief affects him physcially and mentally. Time processed how covering up his grief with prescription pills has not helped him work through this grief and explored ways to help him open up about his grief. Bryan Patton was given information about Hospice bereavement counseling in [place] and felt that he was open to receiving counseling, acknowledging the importance of working through his grief and mental health issues.    Bryan Patton, Bryan Patton 01/02/2013, 3:02 PM

## 2013-01-02 NOTE — Progress Notes (Signed)
Pt came to the nurse's station around midnight c/o being restless and tossing/turning.  Pt reported the his legs couldn't be still.  Pt was given Robaxin at this time. Pt had asked for a sleep aid, but he did not have one available.  Around 0110, pt came back to the nurse's station again c/o restless legs and not being able to relax.  By the time RN called the PA and got an order for a sleep aid, pt was asleep in bed.  MHT reports that at 15 min checks, pt has been sleeping peacefully, and not tossing and turning as he had reported earlier.  Pt is again up at the nurse's station stating he has been tossing/turning and cannot sleep.  This Clinical research associate informed pt that Clinical research associate, along with MHT, has monitored pt's sleep all night and that when writer came to his room with sleep aid, he was resting calmly and did not respond to writer's calling his name.  Pt was informed it was too late for a sleep aid.  Pt was given a prn dose of LIbrium at this time for withdrawal symptoms.  Safety checks q15 minutes for safety continued.

## 2013-01-02 NOTE — Progress Notes (Signed)
[facility] MD Progress Note  01/02/2013 2:46 PM Bryan Patton  MRN:  [id number]  Subjective:  Stafford reports, "I don't want Risperdal. I only take it when I'm hearing voices. I'm here for detox off of pain pills and nerve pills that my doctors were giving me too much of. I'm still having chills, cold sweats and I feel weak. That is why I'm lying down in my bed. I don't feel like going to groups and listening to other people's sad stories. I got my own sad story. My wife died recently. I'm still not ready to talk about it to any one. I have an appointment to see my psychiatrist in July. I will rather wait to see him first before I can start any medicines".    Diagnosis:   Axis I: Schizoaffective Disorder and Benzodiazepine dependence, Opioid dependence Axis II: Deferred Axis III:  Past Medical History  Diagnosis Date  . Neck injury   . Back injury   . Scoliosis   . Hepatitis C    Axis IV: other psychosocial or environmental problems and Recent loss of a loved one, substance dependence Axis V: 41-50 serious symptoms  ADL's:  Fairly intact  Sleep: Fair  Appetite:  Fair  Suicidal Ideation:  Plan:  Denies Intent:  Denies Means:  Denies Homicidal Ideation:  Plan:  Denies Intent:  Denies Means:  Denies AEB (as evidenced by):  Psychiatric Specialty Exam: Review of Systems  Constitutional: Positive for chills, malaise/fatigue and diaphoresis.  HENT: Negative.   Eyes: Negative.   Respiratory: Negative.   Cardiovascular: Negative.   Gastrointestinal: Positive for nausea and abdominal pain.  Genitourinary: Negative.   Musculoskeletal: Positive for myalgias.  Skin: Negative.   Neurological: Positive for tremors and weakness.  Psychiatric/Behavioral: Positive for depression and substance abuse (Opiate, Benzodiazepines). Negative for suicidal ideas, hallucinations and memory loss. The patient is nervous/anxious. The patient does not have insomnia.     Blood pressure 93/57, pulse 112,  temperature 98.1 F (36.7 C), temperature source Oral, resp. rate 16.There is no weight on file to calculate BMI.  General Appearance: Disheveled  Eye Contact::  Minimal  Speech:  Clear and Coherent  Volume:  Decreased  Mood:  Anxious and Depressed  Affect:  Restricted  Thought Process:  Coherent and Goal Directed  Orientation:  Full (Time, Place, and Person)  Thought Content:  Rumination  Suicidal Thoughts:  No  Homicidal Thoughts:  No  Memory:  Immediate;   Good Recent;   Good Remote;   Good  Judgement:  Fair  Insight:  Fair  Psychomotor Activity:  Restlessness and Tremor  Concentration:  Fair  Recall:  Good  Akathisia:  No  Handed:  Right  AIMS (if indicated):     Assets:  Desire for Improvement  Sleep:  Number of Hours: 5.5   Current Medications: Current Facility-Administered Medications  Medication Dose Route Frequency Provider Last Rate Last Dose  . acetaminophen (TYLENOL) tablet 650 mg  650 mg Oral Q6H PRN Verne Spurr, PA-C   650 mg at 01/02/13 4098  . alum & mag hydroxide-simeth (MAALOX/MYLANTA) 200-200-20 MG/5ML suspension 30 mL  30 mL Oral Q4H PRN Verne Spurr, PA-C      . carbamazepine (EQUETRO) 12 hr capsule 200 mg  200 mg Oral Q2000 Sanjuana Kava, NP   200 mg at 01/01/13 2003  . chlordiazePOXIDE (LIBRIUM) capsule 25 mg  25 mg Oral Q6H PRN Verne Spurr, PA-C   25 mg at 01/02/13 0434  . chlordiazePOXIDE (LIBRIUM) capsule  25 mg  25 mg Oral TID Verne Spurr, PA-C   25 mg at 01/02/13 1155   Followed by  . [START ON 01/03/2013] chlordiazePOXIDE (LIBRIUM) capsule 25 mg  25 mg Oral BH-qamhs Verne Spurr, PA-C       Followed by  . [START ON 01/04/2013] chlordiazePOXIDE (LIBRIUM) capsule 25 mg  25 mg Oral Daily Verne Spurr, PA-C      . cloNIDine (CATAPRES) tablet 0.1 mg  0.1 mg Oral QID Verne Spurr, PA-C   0.1 mg at 01/01/13 1133   Followed by  . [START ON 01/03/2013] cloNIDine (CATAPRES) tablet 0.1 mg  0.1 mg Oral BH-qamhs Verne Spurr, PA-C       Followed by  .  [START ON 01/05/2013] cloNIDine (CATAPRES) tablet 0.1 mg  0.1 mg Oral QAC breakfast Verne Spurr, PA-C      . dicyclomine (BENTYL) tablet 20 mg  20 mg Oral Q6H PRN Verne Spurr, PA-C      . diphenhydrAMINE (BENADRYL) capsule 50 mg  50 mg Oral QHS,MR X 1 Spencer E Simon, PA-C      . feeding supplement (ENSURE COMPLETE) liquid 237 mL  237 mL Oral BID BM Jeoffrey Massed, RD   237 mL at 01/02/13 1402  . hydrOXYzine (ATARAX/VISTARIL) tablet 25 mg  25 mg Oral Q6H PRN Verne Spurr, PA-C   25 mg at 01/01/13 2142  . loperamide (IMODIUM) capsule 2-4 mg  2-4 mg Oral PRN Verne Spurr, PA-C      . magnesium hydroxide (MILK OF MAGNESIA) suspension 30 mL  30 mL Oral Daily PRN Verne Spurr, PA-C      . meloxicam (MOBIC) tablet 15 mg  15 mg Oral Daily Sanjuana Kava, NP   15 mg at 01/02/13 0827  . methocarbamol (ROBAXIN) tablet 500 mg  500 mg Oral Q8H PRN Verne Spurr, PA-C   500 mg at 01/02/13 0006  . multivitamin with minerals tablet 1 tablet  1 tablet Oral Daily Verne Spurr, PA-C   1 tablet at 01/02/13 1610  . naproxen (NAPROSYN) tablet 500 mg  500 mg Oral BID PRN Verne Spurr, PA-C   500 mg at 01/01/13 2002  . ondansetron (ZOFRAN-ODT) disintegrating tablet 4 mg  4 mg Oral Q6H PRN Verne Spurr, PA-C      . polyethylene glycol (MIRALAX / GLYCOLAX) packet 17 g  17 g Oral Daily Sanjuana Kava, NP   17 g at 01/01/13 1134  . pregabalin (LYRICA) capsule 100 mg  100 mg Oral TID Sanjuana Kava, NP   100 mg at 01/02/13 1217  . thiamine (B-1) injection 100 mg  100 mg Intramuscular Once [employer], PA-C      . thiamine (VITAMIN B-1) tablet 100 mg  100 mg Oral Daily Verne Spurr, PA-C   100 mg at 01/02/13 9604    Lab Results: No results found for this or any previous visit (from the past 48 hour(s)).  Physical Findings: AIMS:  , ,  ,  ,    CIWA:  CIWA-Ar Total: 3 COWS:  COWS Total Score: 4  Treatment Plan Summary: Daily contact with patient to assess and evaluate symptoms and progress in  treatment Medication management  Plan: Supportive approach/coping skills/relapse prevention. Discontinue Risperdal 0.5mg , patient declined to take. Encouraged out of room, participation in group sessions and application of coping skills when distressed. Will continue to monitor response to/adverse effects of medications in use to assure effectiveness. Continue to monitor mood, behavior and interaction with staff and other patients.  Discharge plan in progress. Continue current plan of care.  Medical Decision Making Problem Points:  Established problem, stable/improving (1), Review of last therapy session (1) and Review of psycho-social stressors (1) Data Points:  Review of medication regiment & side effects (2) Review of new medications or change in dosage (2)  I certify that inpatient services furnished can reasonably be expected to improve the patient's condition.   Sanjuana Kava, FNP, PMHNP 01/02/2013, 2:46 PM

## 2013-01-02 NOTE — Progress Notes (Signed)
Adult Psychoeducational Group Note  Date:  01/02/2013 Time:  10:14 PM  Group Topic/Focus:  NA group  Participation Level:  Active  Participation Quality:  Appropriate  Affect:  Appropriate  Cognitive:  Alert  Insight: Appropriate  Engagement in Group:  Engaged  Modes of Intervention:  Discussion  Additional Comments:    Flonnie Hailstone 01/02/2013, 10:14 PM

## 2013-01-02 NOTE — BHH Group Notes (Signed)
[facility] LCSW Aftercare Discharge Planning Group Note   01/02/2013 10:17 AM  Participation Quality:  DID NOT ATTEND   CSW visited pt in room in attempt to complete PSA and check in. Pt refused and stated that he was too tired. Pt agreed to PSA later in the day.    Smart, [employer]

## 2013-01-02 NOTE — Progress Notes (Signed)
Patient ID: Bryan Patton, male   DOB: 29-Jul-1980, 32 y.o.   MRN: [id number] Pt did not attend group.

## 2013-01-03 MED ORDER — HYDROXYZINE HCL 50 MG PO TABS
50.0000 mg | ORAL_TABLET | Freq: Every day | ORAL | Status: DC
  Administered 2013-01-03: 50 mg via ORAL
  Filled 2013-01-03: qty 4
  Filled 2013-01-03 (×2): qty 1
  Filled 2013-01-03: qty 4

## 2013-01-03 MED ORDER — NICOTINE 21 MG/24HR TD PT24
21.0000 mg | MEDICATED_PATCH | Freq: Every day | TRANSDERMAL | Status: DC
  Administered 2013-01-03: 21 mg via TRANSDERMAL
  Filled 2013-01-03 (×4): qty 1

## 2013-01-03 NOTE — BHH Suicide Risk Assessment (Signed)
[facility] INPATIENT: Family/Significant Other Suicide Prevention Education  Suicide Prevention Education:  Education Completed; No one has been identified by the patient as the family member/significant other with whom the patient will be residing, and identified as the person(s) who will aid the patient in the event of a mental health crisis (suicidal ideations/suicide attempt). With written consent from the patient, the family member/significant other has been provided the following suicide prevention education, prior to the and/or following the discharge of the patient.  The suicide prevention education provided includes the following:  Suicide risk factors  Suicide prevention and interventions  National Suicide Hotline telephone number  [facility] assessment telephone number  [facility] Emergency Assistance 911  [facility] and/or Residential Mobile Crisis Unit telephone number Request made of family/significant other to:  Remove weapons (e.g., guns, rifles, knives), all items previously/currently identified as safety concern.  Remove drugs/medications (over-the-counter, prescriptions, illicit drugs), all items previously/currently identified as a safety concern. The family member/significant other verbalizes understanding of the suicide prevention education information provided. The family member/significant other agrees to remove the items of safety concern listed above.  Pt did not c/o SI at admission, nor have they endorsed SI during their stay here. SPE not required. Pt still provided with SPI pamphlet and encouraged to ask questions or voice any concerns.   Mihcael Ledee Smart LCSWA 01/03/2013 8:35 AM

## 2013-01-03 NOTE — BHH Group Notes (Signed)
[facility] LCSW Aftercare Discharge Planning Group Note   01/03/2013 10:23 AM  Participation Quality:  DID NOT ATTEND   Smart, Herbert Seta

## 2013-01-03 NOTE — Progress Notes (Signed)
Adult Psychoeducational Group Note  Date:  01/03/2013 Time:  2:48 PM  Group Topic/Focus: Stress Management Overcoming Stress:   The focus of this group is to define stress and help patients assess their triggers.  Participation Level:  Did Not Attend  Participation Quality:  Did Not Attend  Affect:  Did Not Attend  Cognitive:  Did Not Attend  Insight: None  Engagement in Group:  None  Modes of Intervention:  Did not attend  Additional Comments:  Patient chose not to attend group.  Delia Chimes 01/03/2013, 2:48 PM

## 2013-01-03 NOTE — Progress Notes (Signed)
D   Pt is cooperative and pleasant on approach   He is very talkative and somewhat anxious   He reports only minimal withdrawal symptoms   He does complain of not sleeping well due to his roommate snoring   He reports he is being discharged tomorrow and requested information about his medications A   Verbal support given  Medications administered and effectiveness monitored  Medications explained to pt  Q 15 min checks R   Pt verbalized understanding and is safe at present

## 2013-01-03 NOTE — Progress Notes (Signed)
Patient ID: Bryan Patton, male   DOB: 11-08-1980, 32 y.o.   MRN: [id number] [facility] MD Progress Note  01/03/2013 2:06 PM Bryan Patton  MRN:  [id number]  Subjective:  Bryan Patton reports today that he is not sleeping well at night. Complained of having restless leg problems at night. To take Neurontin that was ordered for his anxiety. Says it makes him feel very angry. Adds that he does not need any treatment center after discharge. Plans on going to his AA/NA meetings with the support of his family. Will engaging in physical fitness and eating right as well.   Diagnosis:   Axis I: Schizoaffective Disorder and Benzodiazepine dependence, Opioid dependence Axis II: Deferred Axis III:  Past Medical History  Diagnosis Date  . Neck injury   . Back injury   . Scoliosis   . Hepatitis C    Axis IV: other psychosocial or environmental problems and Recent loss of a loved one, substance dependence Axis V: 41-50 serious symptoms  ADL's:  Fairly intact  Sleep: Fair  Appetite:  Fair  Suicidal Ideation:  Plan:  Denies Intent:  Denies Means:  Denies Homicidal Ideation:  Plan:  Denies Intent:  Denies Means:  Denies AEB (as evidenced by):  Psychiatric Specialty Exam: Review of Systems  Constitutional: Positive for chills, malaise/fatigue and diaphoresis.  HENT: Negative.   Eyes: Negative.   Respiratory: Negative.   Cardiovascular: Negative.   Gastrointestinal: Positive for nausea and abdominal pain.  Genitourinary: Negative.   Musculoskeletal: Positive for myalgias.  Skin: Negative.   Neurological: Positive for tremors and weakness.  Psychiatric/Behavioral: Positive for depression and substance abuse (Opiate, Benzodiazepines). Negative for suicidal ideas, hallucinations and memory loss. The patient is nervous/anxious. The patient does not have insomnia.     Blood pressure 104/71, pulse 94, temperature 98 F (36.7 C), temperature source Oral, resp. rate 16, height 5\' 9"  (1.753 m),  weight 74.844 kg (165 lb).Body mass index is 24.36 kg/(m^2).  General Appearance: Disheveled  Eye Contact::  Minimal  Speech:  Clear and Coherent  Volume:  Decreased  Mood:  Anxious and Depressed  Affect:  Restricted  Thought Process:  Coherent and Goal Directed  Orientation:  Full (Time, Place, and Person)  Thought Content:  Rumination  Suicidal Thoughts:  No  Homicidal Thoughts:  No  Memory:  Immediate;   Good Recent;   Good Remote;   Good  Judgement:  Fair  Insight:  Fair  Psychomotor Activity:  Restlessness and Tremor  Concentration:  Fair  Recall:  Good  Akathisia:  No  Handed:  Right  AIMS (if indicated):     Assets:  Desire for Improvement  Sleep:  Number of Hours: 3.75   Current Medications: Current Facility-Administered Medications  Medication Dose Route Frequency Provider Last Rate Last Dose  . acetaminophen (TYLENOL) tablet 650 mg  650 mg Oral Q6H PRN Verne Spurr, PA-C   650 mg at 01/02/13 7829  . alum & mag hydroxide-simeth (MAALOX/MYLANTA) 200-200-20 MG/5ML suspension 30 mL  30 mL Oral Q4H PRN Verne Spurr, PA-C      . carbamazepine (EQUETRO) 12 hr capsule 200 mg  200 mg Oral Q2000 Sanjuana Kava, NP   200 mg at 01/02/13 1948  . chlordiazePOXIDE (LIBRIUM) capsule 25 mg  25 mg Oral Q6H PRN Verne Spurr, PA-C   25 mg at 01/03/13 0344  . chlordiazePOXIDE (LIBRIUM) capsule 25 mg  25 mg Oral BH-qamhs Neil Mashburn, PA-C   25 mg at 01/03/13 5621   Followed by  . [  START ON 01/04/2013] chlordiazePOXIDE (LIBRIUM) capsule 25 mg  25 mg Oral Daily Verne Spurr, PA-C      . cloNIDine (CATAPRES) tablet 0.1 mg  0.1 mg Oral BH-qamhs Neil Mashburn, PA-C   0.1 mg at 01/03/13 0835   Followed by  . [START ON 01/05/2013] cloNIDine (CATAPRES) tablet 0.1 mg  0.1 mg Oral QAC breakfast Verne Spurr, PA-C      . dicyclomine (BENTYL) tablet 20 mg  20 mg Oral Q6H PRN Verne Spurr, PA-C      . diphenhydrAMINE (BENADRYL) capsule 50 mg  50 mg Oral QHS,MR X 1 Spencer E Simon, PA-C   50 mg at  01/02/13 2340  . feeding supplement (ENSURE COMPLETE) liquid 237 mL  237 mL Oral BID BM Jeoffrey Massed, RD   237 mL at 01/03/13 1610  . hydrOXYzine (ATARAX/VISTARIL) tablet 25 mg  25 mg Oral Q6H PRN Verne Spurr, PA-C   25 mg at 01/01/13 2142  . loperamide (IMODIUM) capsule 2-4 mg  2-4 mg Oral PRN Verne Spurr, PA-C      . magnesium hydroxide (MILK OF MAGNESIA) suspension 30 mL  30 mL Oral Daily PRN Verne Spurr, PA-C      . meloxicam (MOBIC) tablet 15 mg  15 mg Oral Daily Sanjuana Kava, NP   15 mg at 01/03/13 0835  . methocarbamol (ROBAXIN) tablet 500 mg  500 mg Oral Q8H PRN Verne Spurr, PA-C   500 mg at 01/02/13 2138  . multivitamin with minerals tablet 1 tablet  1 tablet Oral Daily Verne Spurr, PA-C   1 tablet at 01/03/13 0836  . naproxen (NAPROSYN) tablet 500 mg  500 mg Oral BID PRN Verne Spurr, PA-C   500 mg at 01/01/13 2002  . ondansetron (ZOFRAN-ODT) disintegrating tablet 4 mg  4 mg Oral Q6H PRN Verne Spurr, PA-C      . polyethylene glycol (MIRALAX / GLYCOLAX) packet 17 g  17 g Oral Daily Sanjuana Kava, NP   17 g at 01/01/13 1134  . pregabalin (LYRICA) capsule 100 mg  100 mg Oral TID Sanjuana Kava, NP   100 mg at 01/03/13 1201  . thiamine (B-1) injection 100 mg  100 mg Intramuscular Once [employer], PA-C      . thiamine (VITAMIN B-1) tablet 100 mg  100 mg Oral Daily Verne Spurr, PA-C   100 mg at 01/03/13 9604    Lab Results: No results found for this or any previous visit (from the past 48 hour(s)).  Physical Findings: AIMS: Facial and Oral Movements Muscles of Facial Expression: None, normal Lips and Perioral Area: None, normal Jaw: None, normal Tongue: None, normal,Extremity Movements Upper (arms, wrists, hands, fingers): None, normal Lower (legs, knees, ankles, toes): None, normal, Trunk Movements Neck, shoulders, hips: None, normal, Overall Severity Severity of abnormal movements (highest score from questions above): None, normal Incapacitation due to abnormal  movements: None, normal Patient's awareness of abnormal movements (rate only patient's report): No Awareness, Dental Status Current problems with teeth and/or dentures?: No Does patient usually wear dentures?: No  CIWA:  CIWA-Ar Total: 1 COWS:  COWS Total Score: 2  Treatment Plan Summary: Daily contact with patient to assess and evaluate symptoms and progress in treatment Medication management  Plan: Supportive approach/coping skills/relapse prevention. Hydroxyzine 50 mg Q bedtime for sleep Encouraged out of room, participation in group sessions and application of coping skills when distressed. Will continue to monitor response to/adverse effects of medications in use to assure effectiveness. Continue to monitor  mood, behavior and interaction with staff and other patients. Discharge plan in progress. Continue current plan of care.  Medical Decision Making Problem Points:  Established problem, stable/improving (1), Review of last therapy session (1) and Review of psycho-social stressors (1) Data Points:  Review of medication regiment & side effects (2) Review of new medications or change in dosage (2)  I certify that inpatient services furnished can reasonably be expected to improve the patient's condition.   Sanjuana Kava, FNP, PMHNP 01/03/2013, 2:06 PM

## 2013-01-03 NOTE — [facility] Group Notes (Signed)
[facility] LCSW Group Therapy  01/03/2013 2:53 PM  Type of Therapy:  Group Therapy  Participation Level:  Minimal  Participation Quality:  Drowsy  Affect:  Depressed  Cognitive:  Appropriate  Insight:  Engaged  Engagement in Therapy:  Improving  Modes of Intervention:  Discussion, Education, Exploration, Socialization and Support  Summary of Progress/Problems:  Finding Balance in Life. Today's group focused on defining balance in one's own words, identifying things that can knock one off balance, and exploring healthy ways to maintain balance in life. Group members were asked to provide an example of a time when they felt off balance, describe how they handled that situation,and process healthier ways to regain balance in the future. Group members were asked to share the most important tool for maintaining balance that they learned while at [facility] and how they plan to apply this method after discharge. Bryan Patton was drowsy throughout group and fell asleep frequently. He apologized and stated that he did not sleep well last night due to the snoring of his roommate. Bryan Patton explained how intense grief surrounding the death of his wife has thrown him off balance. Bryan Patton processed how allowing himself to feel these emotions (grief, pain, and sadness) will help him face this loss and work through this grief.    Smart, Stevie Charter 01/03/2013, 2:53 PM

## 2013-01-03 NOTE — Progress Notes (Signed)
D:  Patient's self inventory sheet, patient has poor sleep, good appetite, low energy level, good attention span.  Rated depression and hopelessness #3, anxiety #2.  Has experienced chilling in past 24 hours.  Denied SI.  Back pain, worst pain #7, zero pain goal.  After discharge, plans to take care of himself and will probably live with his mother.  Does have discharge plans.  No problems taking meds after discharge. A:  Medications administered per MD orders.  Emotional support and encouragement given patient. R:  Denied SI and HI.  Denied A/V hallucinations.  Contracts for safety.  Will continue to monitor patient for safety with 5 minute checks.  Safety maintained. Refused miralax this morning, stated he did not ned this medication.

## 2013-01-04 MED ORDER — HYDROXYZINE HCL 50 MG PO TABS: 50.0000 mg | ORAL_TABLET | Freq: Every day | ORAL | Status: DC

## 2013-01-04 MED ORDER — HYDROXYZINE HCL 50 MG PO TABS: 50.0000 mg | ORAL_TABLET | Freq: Every day | ORAL | Status: AC

## 2013-01-04 MED ORDER — MELOXICAM 15 MG PO TABS: 15.0000 mg | ORAL_TABLET | Freq: Every day | ORAL | Status: AC

## 2013-01-04 MED ORDER — POLYETHYLENE GLYCOL 3350 17 G PO PACK: 17.0000 g | PACK | Freq: Every day | ORAL | Status: AC

## 2013-01-04 MED ORDER — CARBAMAZEPINE ER 200 MG PO CP12: 200.0000 mg | ORAL_CAPSULE | Freq: Every day | ORAL | Status: AC

## 2013-01-04 MED ORDER — CARBAMAZEPINE ER 100 MG PO CP12
200.0000 mg | ORAL_CAPSULE | Freq: Every day | ORAL | Status: DC
  Filled 2013-01-04: qty 8

## 2013-01-04 MED ORDER — PREGABALIN 100 MG PO CAPS: 100.0000 mg | ORAL_CAPSULE | Freq: Three times a day (TID) | ORAL | Status: AC

## 2013-01-04 NOTE — [facility] Suicide Risk Assessment (Signed)
Suicide Risk Assessment  Discharge Assessment     Demographic Factors:  Male, Caucasian, Low socioeconomic status, Living alone and Unemployed  Mental Status Per Nursing Assessment::   On Admission:  NA  Current Mental Status by Physician: NA  Loss Factors: Decrease in vocational status, Decline in physical health and Financial problems/change in socioeconomic status  Historical Factors: Family history of suicide  Risk Reduction Factors:   Sense of responsibility to family, Religious beliefs about death, Positive social support, Positive therapeutic relationship and Positive coping skills or problem solving skills  Continued Clinical Symptoms:  Bipolar Disorder:   Mixed State Alcohol/Substance Abuse/Dependencies Schizophrenia:   Command hallucinatons Previous Psychiatric Diagnoses and Treatments  Cognitive Features That Contribute To Risk:  Closed-mindedness Polarized thinking    Suicide Risk:  Mild:  Suicidal ideation of limited frequency, intensity, duration, and specificity.  There are no identifiable plans, no associated intent, mild dysphoria and related symptoms, good self-control (both objective and subjective assessment), few other risk factors, and identifiable protective factors, including available and accessible social support.  Discharge Diagnoses:   AXIS I:  Schizoaffective Disorder, Substance Induced Mood Disorder and Opioid dependence AXIS II:  Deferred AXIS III:   Past Medical History  Diagnosis Date  . Neck injury   . Back injury   . Scoliosis   . Hepatitis C    AXIS IV:  economic problems, housing problems, occupational problems, other psychosocial or environmental problems and problems related to social environment AXIS V:  51-60 moderate symptoms  Plan Of Care/Follow-up recommendations:  Activity:  as tolerated Diet:  Regular  Is patient on multiple antipsychotic therapies at discharge:  No   Has Patient had three or more failed trials of  antipsychotic monotherapy by history:  No  Recommended Plan for Multiple Antipsychotic Therapies: Not applicable  Neftaly Swiss,JANARDHAHA R. 01/04/2013, 11:31 AM

## 2013-01-04 NOTE — Progress Notes (Signed)
Adult Psychoeducational Group Note  Date:  01/04/2013 Time:  11:40 AM  Group Topic/Focus:  Recovery Goals:   The focus of this group is to identify appropriate goals for recovery and establish a plan to achieve them.  Participation Level:  Active  Participation Quality:  Appropriate  Affect:  Appropriate  Cognitive:  Appropriate  Insight: Appropriate  Engagement in Group:  Engaged  Modes of Intervention:  Activity, Discussion and Problem-solving  Additional Comments:  Patient was late to group, but was active once he attend. Patient also shared his experience with recovery and stated that he is ready to change in honor of his late wife.   Lyndee Hensen 01/04/2013, 11:40 AM

## 2013-01-04 NOTE — Progress Notes (Signed)
[facility] Adult Case Management Discharge Plan :  Will you be returning to the same living situation after discharge: Yes,  home with mother At discharge, do you have transportation home?:Yes,  mother Do you have the ability to pay for your medications:Yes,  cardinal medicaid  Release of information consent forms completed and in the chart;  Patient's signature needed at discharge.  Patient to Follow up at: Follow-up Information   Follow up with [facility]  On 01/17/2013. (Appt. with Dr. Otis Dials at 1:30PM for hospital follow-up/psych evaluation)    Contact information:   [address] [place], [place] [address] phone: [phone] fax: [phone]      Patient denies SI/HI:   Yes,  in group    Safety Planning and Suicide Prevention discussed:  Yes,  Pt did not endorse SI during admission or during stay at [facility]. SPE not needed. Pt provided with SPI pamphlet and encouraged to ask questions/voice any concerns.   Smart, Grethel Zenk 01/04/2013, 10:33 AM

## 2013-01-04 NOTE — Progress Notes (Signed)
Patient did attend the evening karaoke group.  

## 2013-01-04 NOTE — Progress Notes (Signed)
Patient ID: Bryan Patton, male   DOB: June 26, 1981, 32 y.o.   MRN: [id number] Patient denies si/hi/avh. Pt verbalizes understanding of discharge instructions, prescriptions and follow up appts. Pt signed for their belongings from locker. Pt was walked to the lobby and was transported by his mother.

## 2013-01-04 NOTE — Progress Notes (Signed)
Nursing Progress Note. D. Patient presents with disheveled appearance-poor hygiene, depressed mood, affect blunted in am. Patient states '' I feel awful I haven't had any sleep all night and ya'll are here waking me up''A. Patient allowed to ventilate and support given. Sleep hygiene discussed . R. Patient denies any suicidal or homicidal ideation at this time. No auditory or visual hallucinations at this time. No further voiced concerns at this time. Pt remains on q15 minute checks for safety. Will continue to monitor.

## 2013-01-04 NOTE — Discharge Summary (Signed)
Physician Discharge Summary Note  Patient:  Bryan Patton is an 32 y.o., male MRN:  [id number] DOB:  Aug 10, 1980 Patient phone:  [phone] (home)  Patient address:   [address] [facility] [address],   Date of Admission:  12/31/2012 Date of Discharge: 01/04/13  Reason for Admission:  drug dependence  Discharge Diagnoses: Principal Problem:   Benzodiazepine dependence Active Problems:   Opioid dependence   Schizoaffective disorder  Review of Systems  Constitutional: Negative.   HENT: Negative.   Eyes: Negative.   Respiratory: Negative.   Cardiovascular: Negative.   Gastrointestinal: Negative.   Genitourinary: Negative.   Musculoskeletal: Negative.   Skin: Negative.   Neurological: Negative.   Endo/Heme/Allergies: Negative.   Psychiatric/Behavioral: Positive for substance abuse (Opioid dependence, Benzodiazepine dependence). Negative for depression, suicidal ideas, hallucinations and memory loss. The patient is nervous/anxious (Stabilized with medication prior to dischrage). The patient does not have insomnia.    Axis Diagnosis:   AXIS I:  Schizoaffective Disorder and Benzodiazepine dependence, Opioid dependence AXIS II:  Deferred AXIS III:   Past Medical History  Diagnosis Date  . Neck injury   . Back injury   . Scoliosis   . Hepatitis C    AXIS IV:  other psychosocial or environmental problems and Substance dependence AXIS V:  64  Level of Care:  OP  Hospital Course:  This is a 32 year old Caucasian male. Admitted to [facility] from the [facility] ED with complaints of benzodiazepine and opioid dependence requesting detox. Patient reports, "My mother took me to the hospital on Sunday. I was prescribed bunch of pain and anxiety pills for my neck/spinal cord injuries and anxiety symptoms. She and I thought and decided that it is time I get off of these medications. I know that I abuse them and my body is becoming dependent on them. I was abusing methadone,  Oxycodone, Morphine, Xanax and Klonopin. I was on these medications for a long time. I was also on some medicines for my mental illness. I was diagnosed with bipolar disorder and schizophrenia a long time ago because I was hallucinating at the time. I have been off of my medicines x 1 month. I don't remember all that I was taken for mental stuff. I feel very depressed also because I lost my wife on the 11th of this month. But, I don't want to get into all that right now. I don't feel good. I feel dizzy, nauseated, my muscles hurt and I feel like something is on my skin".  After admission assessment and evaluation, it was determined that Mr. Bryan Patton will need detoxification treatment protocol to stabilize his systems of drug intoxication and to combat the withdrawal symptoms of these substances as well. Per his UDS report, he tested positive for opiates and benzodiazepines. And his discharge plans included a referral and appointment to an outpatient psychiatric clinic in [place], [place]  ([facility]) for continuation of psychiatric care and medication management. Brittney declined to go to any substance abuse treatment center. He said that he will resume his participation in the AA/NA meetings being offered and held within his community. He admits that he good family support, and that his family is there for him and willing to take to meeting as well.  He was then started on both Librium/Clonidine treatment protocol for his drug detoxification. He was also enrolled in group counseling sessions and activities where he was taught, counseled and  learned coping skills that should help him after discharge  to cope better, manage his substance abuse problems to maintain a much longer sobriety. He declined to take any antipsychotic medications for his schizoaffective disorder. He added that he only take antipsychotics when he is hearing voices.  Besides the detoxification protocol, patient also received Carbamazepine (Equatro)  200 mg Q bedtime for mood stabilization and Hydroxyzine 50 mg Q bedtime for anxiety/sleep. He was also was enrolled and attended AA/NA meetings being offered and held on this unit. He has some previously existing and or identifiable medical conditions that required treatment and or monitoring. He received medication management for all those health issues as well. He was monitored closely for any potential problems that may arise as a result of result of and or during detoxification treatment. Patient tolerated his treatment regimen and detoxification treatment protocol without any significant adverse effects and or reactions reported.  Patient attended treatment team meeting this am and met with the treatment team members. His reason for admission, present symptoms, substance abuse issues, response to treatment and discharge plans discussed. Patient endorsed that he is doing well and stable for discharge to pursue the next phase of his psychiatric treatment. It was agreed upon that he will continue psychiatric treatment at the [facility] in [place], [place] on 01/17/13 at 1:30 pm with Dr. Leafy Kindle. The address, date, time and contact information for this clinic and appointment provided for patient in writing.   Upon discharge, patient adamantly denies suicidal, homicidal ideations, auditory, visual hallucinations, delusional thinking and or withdrawal symptoms. Patient left [facility] with all personal belongings in no apparent distress. He received 4 days worth supply samples of his [facility] discharge medications. Transportation per family.   Consults:  psychiatry  Significant Diagnostic Studies:  labs: CBC with diff, CMP, UDS, Toxicology tests, U/A, Tegretol levels  Discharge Vitals:   Blood pressure 113/72, pulse 53, temperature 97.7 F (36.5 C), temperature source Oral, resp. rate 16, height 5\' 9"  (1.753 m), weight 74.844 kg (165 lb). Body mass index is 24.36 kg/(m^2). Lab Results:   Results for orders placed during the  hospital encounter of 12/31/12 (from the past 72 hour(s))  CARBAMAZEPINE LEVEL, TOTAL     Status: Abnormal   Collection Time    01/04/13  6:30 AM      Result Value Range   Carbamazepine Lvl 2.4 (*) 4.0 - 12.0 ug/mL    Physical Findings: AIMS: Facial and Oral Movements Muscles of Facial Expression: None, normal Lips and Perioral Area: None, normal Jaw: None, normal Tongue: None, normal,Extremity Movements Upper (arms, wrists, hands, fingers): None, normal Lower (legs, knees, ankles, toes): None, normal, Trunk Movements Neck, shoulders, hips: None, normal, Overall Severity Severity of abnormal movements (highest score from questions above): None, normal Incapacitation due to abnormal movements: None, normal Patient's awareness of abnormal movements (rate only patient's report): No Awareness, Dental Status Current problems with teeth and/or dentures?: No Does patient usually wear dentures?: No  CIWA:  CIWA-Ar Total: 5 COWS:  COWS Total Score: 3  Psychiatric Specialty Exam: See Psychiatric Specialty Exam and Suicide Risk Assessment completed by Attending Physician prior to discharge.  Discharge destination:  Home  Is patient on multiple antipsychotic therapies at discharge:  No   Has Patient had three or more failed trials of antipsychotic monotherapy by history:  No  Recommended Plan for Multiple Antipsychotic Therapies: NA     Medication List    STOP taking these medications       ALPRAZolam 1 MG tablet  Commonly known as:  Prudy Feeler  clonazePAM 1 MG tablet  Commonly known as:  KLONOPIN     docusate sodium 100 MG capsule  Commonly known as:  COLACE     methadone 10 MG tablet  Commonly known as:  DOLOPHINE     morphine 30 MG 12 hr tablet  Commonly known as:  MS CONTIN     oxyCODONE-acetaminophen 10-325 MG per tablet  Commonly known as:  PERCOCET      TAKE these medications     Indication   carbamazepine 200 MG Cp12  Commonly known as:  EQUETRO  Take 1  capsule (200 mg total) by mouth daily at 8 pm. For mood stabilization   Indication:  Mood stabilization     hydrOXYzine 50 MG tablet  Commonly known as:  ATARAX/VISTARIL  Take 1 tablet (50 mg total) by mouth daily at 8 pm. For sleep   Indication:  Anxiety associated with Organic Disease, Insomnia     meloxicam 15 MG tablet  Commonly known as:  MOBIC  Take 1 tablet (15 mg total) by mouth daily. For pain management   Indication:  Pain management     polyethylene glycol packet  Commonly known as:  MIRALAX / GLYCOLAX  Take 17 g by mouth daily. For constipation   Indication:  Constipation     pregabalin 100 MG capsule  Commonly known as:  LYRICA  Take 1 capsule (100 mg total) by mouth 3 (three) times daily. For Restless leg syndrome   Indication:  Nerve Pain After Herpes Zoster or Shingles, Restless Leg Syndrome       Follow-up Information   Follow up with [facility]  On 01/17/2013. (Appt. with Dr. Otis Dials at 1:30PM for hospital follow-up/psych evaluation)    Contact information:   [address] [place], [place] [address] phone: [phone] fax: [phone]     Follow-up recommendations: Activity:  As tolerated Diet: As recommended by your primary care doctor. Keep all scheduled follow-up appointments as recommended.   Comments: Take all your medications as prescribed by your mental healthcare provider. Report any adverse effects and or reactions from your medicines to your outpatient provider promptly. Patient is instructed and cautioned to not engage in alcohol and or illegal drug use while on prescription medicines. In the event of worsening symptoms, patient is instructed to call the crisis hotline, 911 and or go to the nearest ED for appropriate evaluation and treatment of symptoms. Follow-up with your primary care provider for your other medical issues, concerns and or health care needs.    Total Discharge Time:  Greater than 30 minutes.  Signed: Sanjuana Kava,  PMHNP, FNP-BC 01/04/2013, 9:37 AM  Patient is seen, evaluated for suicidal risk assessment and case discussed with psych NP, plan care developed and reviewed the information documented and agree with the discharge treatment plan.  Nehemiah Settle., MD 01/05/2013 12:23 PM

## 2013-01-04 NOTE — [facility] Group Notes (Signed)
[facility] LCSW Aftercare Discharge Planning Group Note   01/04/2013 9:45 AM  Participation Quality:  DID NOT ATTEND  Smart, Bryan Patton

## 2013-01-08 NOTE — Progress Notes (Signed)
Patient Discharge Instructions:  After Visit Summary (AVS):   Faxed to:  01/08/13 Discharge Summary Note:   Faxed to:  01/08/13 Psychiatric Admission Assessment Note:   Faxed to:  01/08/13 Suicide Risk Assessment - Discharge Assessment:   Faxed to:  01/08/13 Faxed/Sent to the Next Level Care provider:  01/08/13 Faxed to [facility] @ [phone]  Jerelene Redden, 01/08/2013, 3:08 PM

## 2013-03-30 ENCOUNTER — Emergency Department: Payer: Self-pay | Admitting: Emergency Medicine

## 2013-03-30 LAB — COMPREHENSIVE METABOLIC PANEL
Albumin: 4.5 g/dL (ref 3.4–5.0)
Anion Gap: 5 — ABNORMAL LOW (ref 7–16)
Calcium, Total: 9.7 mg/dL (ref 8.5–10.1)
Co2: 30 mmol/L (ref 21–32)
EGFR (Non-African Amer.): >60
Glucose: 76 mg/dL (ref 65–99)
Osmolality: 274 (ref 275–301)
Potassium: 3.8 mmol/L (ref 3.5–5.1)
SGOT(AST): 72 U/L — ABNORMAL HIGH (ref 15–37)
Total Protein: 8.1 g/dL (ref 6.4–8.2)

## 2013-03-30 LAB — CBC
HGB: 16.8 g/dL (ref 13.0–18.0)
MCH: 29.8 pg (ref 26.0–34.0)
MCHC: 33.7 g/dL (ref 32.0–36.0)
Platelet: 266 10*3/uL (ref 150–440)
WBC: 11.4 10*3/uL — ABNORMAL HIGH (ref 3.8–10.6)

## 2013-03-30 LAB — TSH: Thyroid Stimulating Horm: 0.69 u[IU]/mL

## 2013-03-30 LAB — ETHANOL
Ethanol %: <0.003 % (ref 0.000–0.080)
Ethanol: <3 mg/dL

## 2013-03-31 LAB — URINALYSIS, COMPLETE
Bacteria: NONE SEEN
Bilirubin,UR: NEGATIVE
Glucose,UR: NEGATIVE mg/dL (ref 0–75)
Leukocyte Esterase: NEGATIVE
Protein: NEGATIVE
Specific Gravity: 1.019 (ref 1.003–1.030)

## 2013-03-31 LAB — DRUG SCREEN, URINE
Amphetamines, Ur Screen: NEGATIVE (ref ?–1000)
Barbiturates, Ur Screen: NEGATIVE (ref ?–200)
Benzodiazepine, Ur Scrn: POSITIVE (ref ?–200)
Cannabinoid 50 Ng, Ur ~~LOC~~: NEGATIVE (ref ?–50)
Methadone, Ur Screen: NEGATIVE (ref ?–300)
Opiate, Ur Screen: POSITIVE (ref ?–300)
Phencyclidine (PCP) Ur S: NEGATIVE (ref ?–25)
Tricyclic, Ur Screen: NEGATIVE (ref ?–1000)

## 2013-04-10 DEATH — deceased

## 2013-04-29 ENCOUNTER — Ambulatory Visit: Payer: Self-pay | Admitting: Neurology

## 2014-08-07 IMAGING — CR DG CHEST 2V
1 series · 2 of 2 positions shown · non-contrast
Comparison: none

REASON FOR EXAM: cough
COMMENTS:

PROCEDURE:     DXR - DXR CHEST PA (OR AP) AND LATERAL  - February 29, 2012  [DATE]
RESULT:     Comparison: 12/07/2009

[Series 1: pa · 0.17mm/px · 2 of 2 slices shown]
[im 1/2]
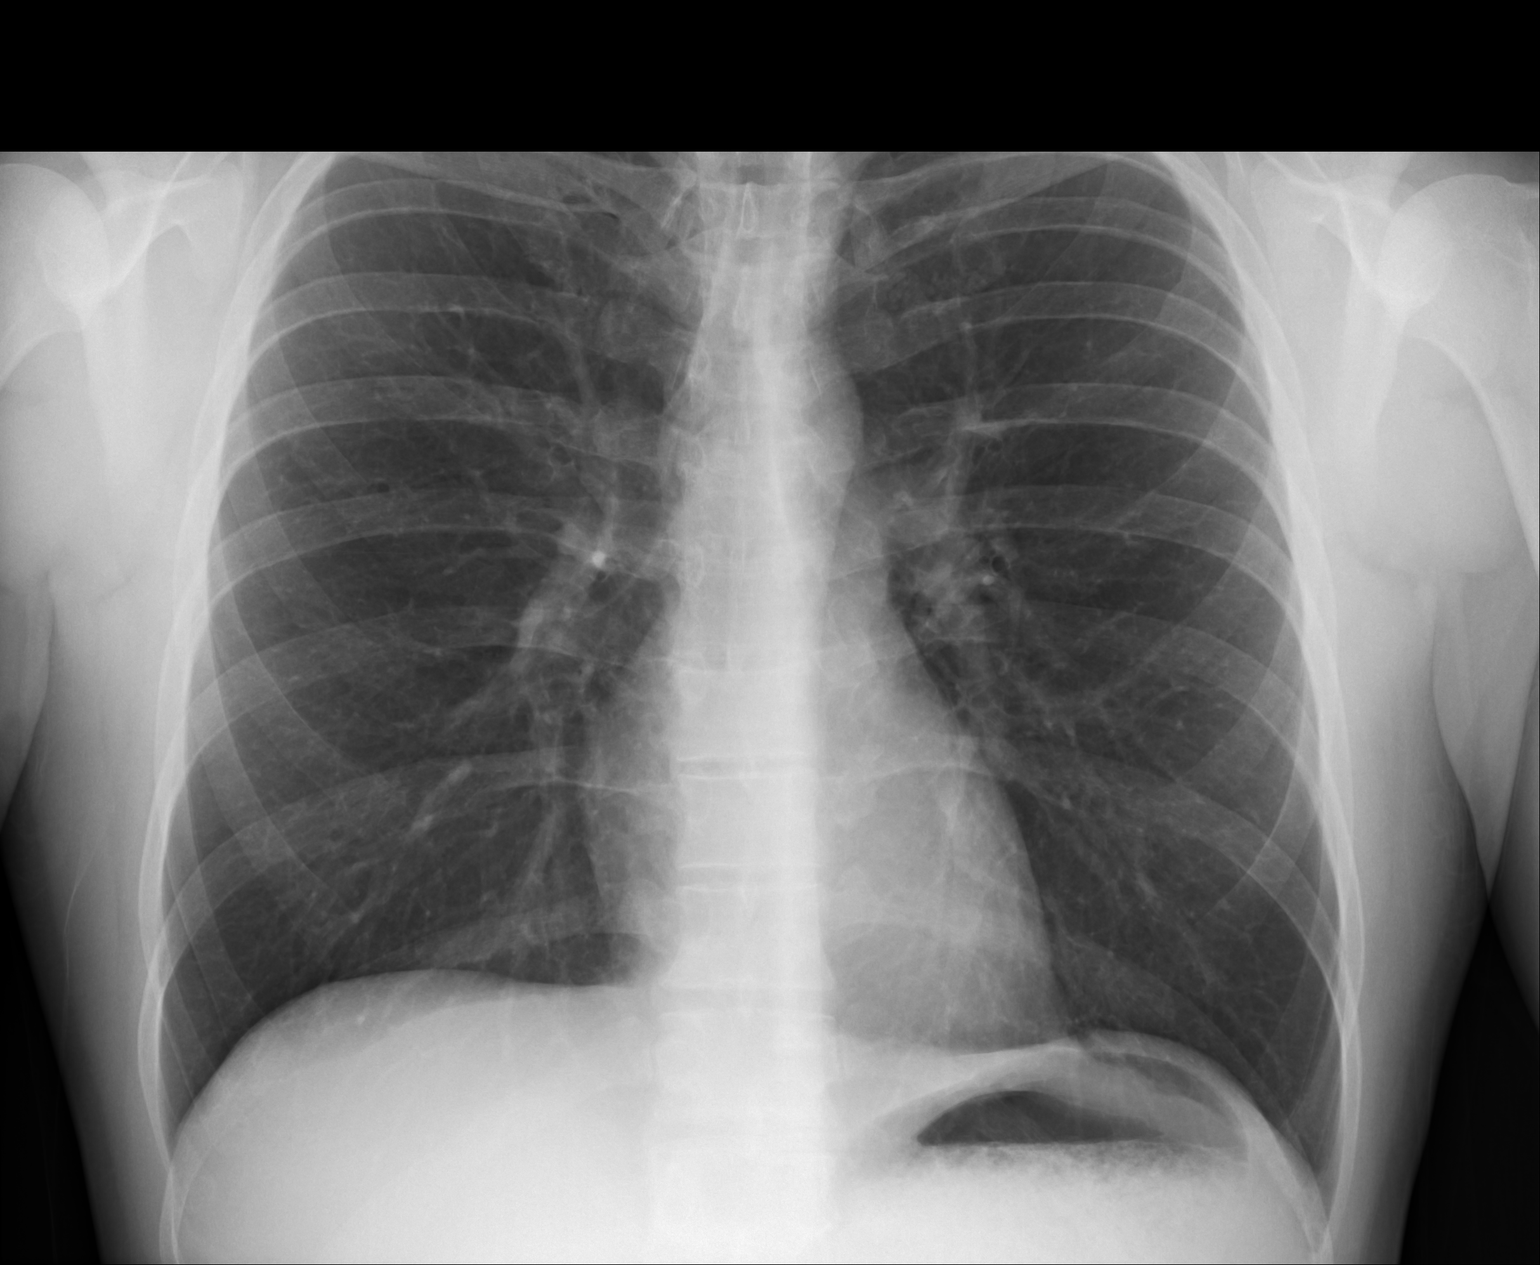
[im 2/2]
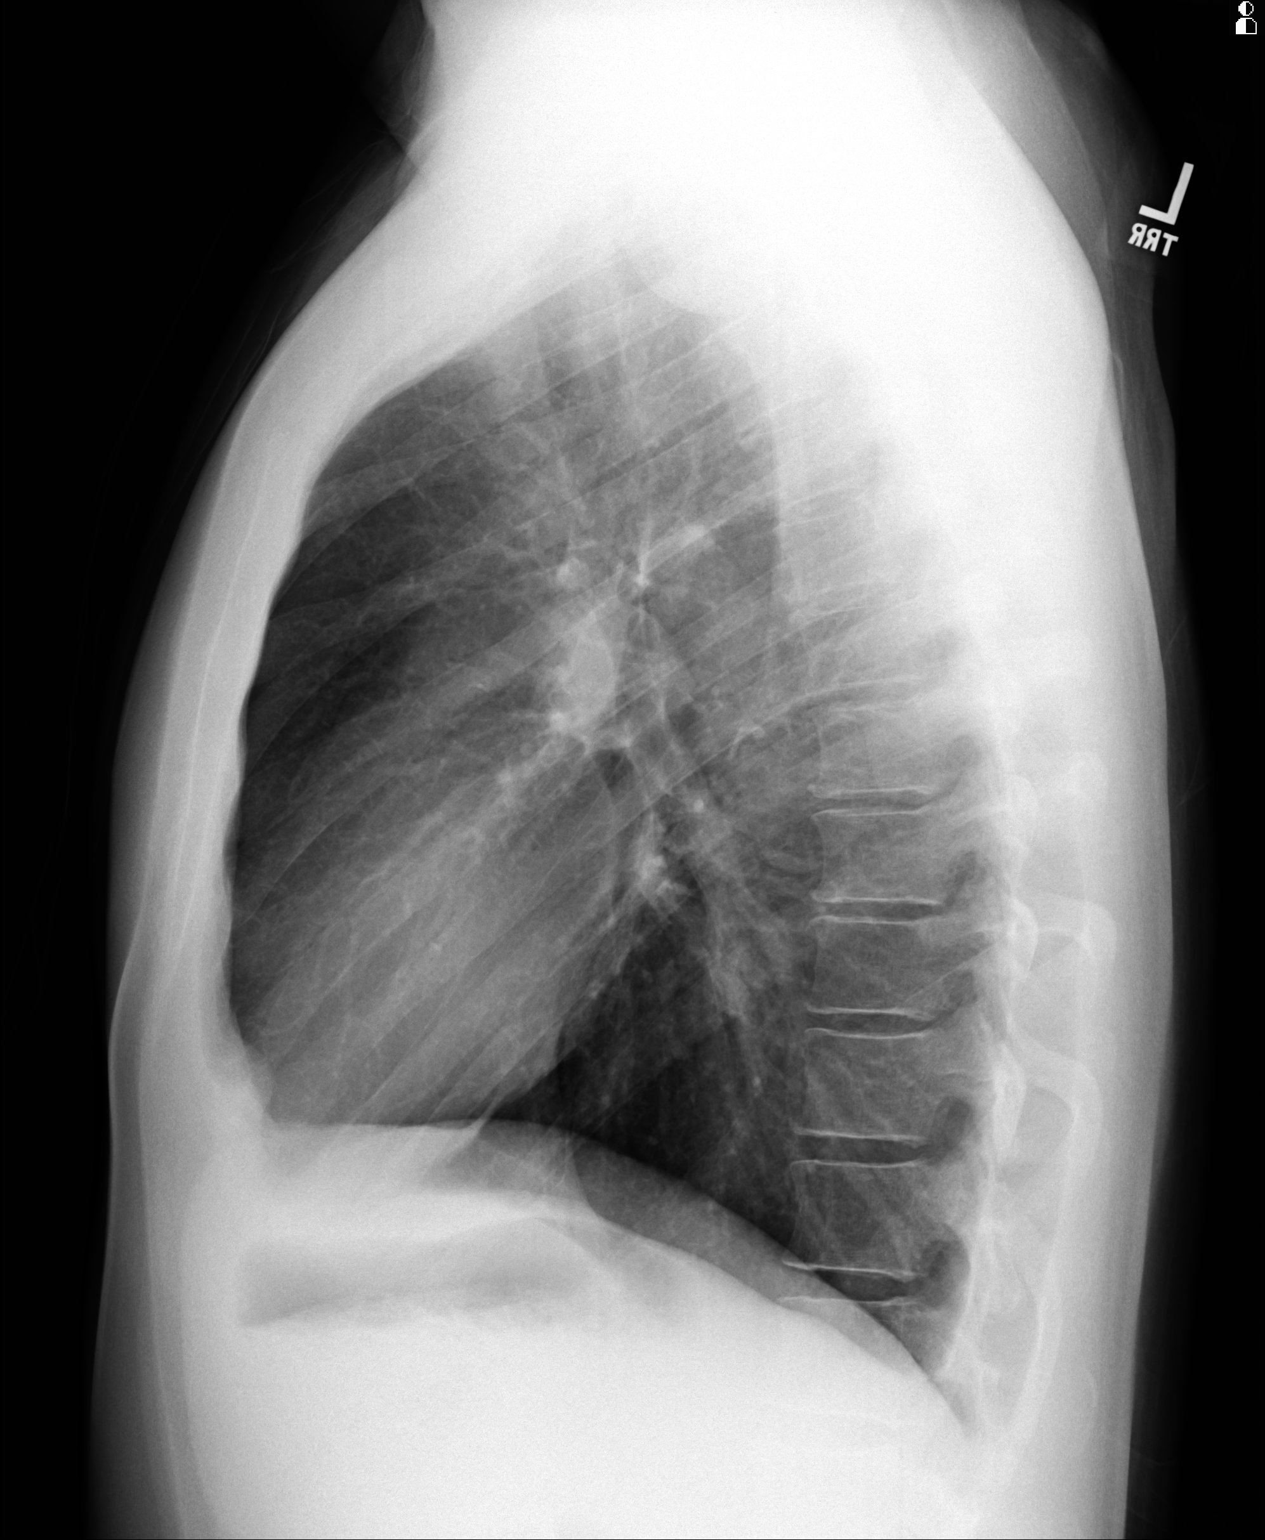

[2 of 2 positions shown; findings below may reference images not displayed]

FINDINGS: The heart and mediastinum are stable. No focal pulmonary opacities.
IMPRESSION: No acute cardiopulmonary disease.

[REDACTED]
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
